# Patient Record
Sex: Female | Born: 1985 | Race: Black or African American | Hispanic: No | Marital: Single | Smoking: Never smoker
Health system: Southern US, Community
[De-identification: ages and names within clinical notes are randomized; demographics above are authoritative.]

## PROBLEM LIST (undated history)

## (undated) DIAGNOSIS — Z789 Other specified health status: Secondary | ICD-10-CM

## (undated) HISTORY — PX: WISDOM TOOTH EXTRACTION: SHX21

## (undated) HISTORY — DX: Other specified health status: Z78.9

---

## 2014-05-02 ENCOUNTER — Other Ambulatory Visit (HOSPITAL_COMMUNITY): Payer: Self-pay | Admitting: Urology

## 2014-05-02 DIAGNOSIS — Z3689 Encounter for other specified antenatal screening: Secondary | ICD-10-CM

## 2014-05-02 LAB — OB RESULTS CONSOLE ANTIBODY SCREEN: Antibody Screen: NEGATIVE

## 2014-05-02 LAB — OB RESULTS CONSOLE ABO/RH: RH TYPE: POSITIVE

## 2014-05-02 LAB — OB RESULTS CONSOLE HEPATITIS B SURFACE ANTIGEN: Hepatitis B Surface Ag: NEGATIVE

## 2014-05-02 LAB — OB RESULTS CONSOLE RPR: RPR: NONREACTIVE

## 2014-05-02 LAB — OB RESULTS CONSOLE HIV ANTIBODY (ROUTINE TESTING): HIV: NONREACTIVE

## 2014-05-02 LAB — OB RESULTS CONSOLE GC/CHLAMYDIA
Chlamydia: NEGATIVE
GC PROBE AMP, GENITAL: NEGATIVE

## 2014-05-02 LAB — OB RESULTS CONSOLE RUBELLA ANTIBODY, IGM: Rubella: IMMUNE

## 2014-05-17 NOTE — L&D Delivery Note (Cosign Needed)
Patient is 29 y.o. G2P1001 [redacted]w[redacted]d admitted for induction of labor due to post dates, hx of normal prenatal care   Delivery Note At 9:37 PM a viable female was delivered via Vaginal, Spontaneous Delivery (Presentation: Right Occiput Anterior).  APGAR: 8, 9; weight pending.   Placenta status: Intact, Spontaneous.  Cord: 3 vessels with the following complications: None.    Patient was noted to be completed dilated and +2 in station. She began pushing and delivered a female infant over intact perineum after several contractions. The anterior and posterior shoulder delivered without difficulty. The remainder of the baby was extracted from the vaginal and placed on mother's abdomen with nursing assistance. Delayed cord clamping was performed. The placenta delivered within several minutes of the infant. The uterus was massaged and noted to be firm. A 2nd degree posterior vaginal laceration was noted that required suturing. Good hemostasis was achieved. The uterus was massaged again and noted to be firm with minimal bleeding  Anesthesia: Epidural  Episiotomy: None Lacerations:  2nd degree vaginal laceration Suture Repair: 3.0 vicryl Est. Blood Loss (mL):  400  Mom to postpartum.  Baby to Couplet care / Skin to Skin.  Delivery supervised by Dr. Connye Burkitt Jama Flavors 10/28/2014, 10:00 PM  OB Fellow Attestation:  I was gloved and present for delivery in its entirety.    Complications: none  Lacerations: 2nd degree perineal laceration  EBL: 400  William Dalton, MD 10:18 PM

## 2014-05-30 ENCOUNTER — Ambulatory Visit (HOSPITAL_COMMUNITY)
Admission: RE | Admit: 2014-05-30 | Discharge: 2014-05-30 | Disposition: A | Discharge status: Home / Self Care | Payer: Medicaid Other | Source: Ambulatory Visit | Attending: Urology | Admitting: Urology

## 2014-05-30 DIAGNOSIS — Z36 Encounter for antenatal screening of mother: Secondary | ICD-10-CM | POA: Diagnosis present

## 2014-05-30 DIAGNOSIS — Z3689 Encounter for other specified antenatal screening: Secondary | ICD-10-CM

## 2014-05-30 DIAGNOSIS — Z3A19 19 weeks gestation of pregnancy: Secondary | ICD-10-CM | POA: Insufficient documentation

## 2014-06-04 ENCOUNTER — Other Ambulatory Visit (HOSPITAL_COMMUNITY): Payer: Self-pay | Admitting: Physician Assistant

## 2014-06-04 DIAGNOSIS — IMO0002 Reserved for concepts with insufficient information to code with codable children: Secondary | ICD-10-CM

## 2014-06-04 DIAGNOSIS — Z0489 Encounter for examination and observation for other specified reasons: Secondary | ICD-10-CM

## 2014-06-27 ENCOUNTER — Ambulatory Visit (HOSPITAL_COMMUNITY)
Admission: RE | Admit: 2014-06-27 | Discharge: 2014-06-27 | Disposition: A | Discharge status: Home / Self Care | Payer: Medicaid Other | Source: Ambulatory Visit | Attending: Physician Assistant | Admitting: Physician Assistant

## 2014-06-27 DIAGNOSIS — Z3A23 23 weeks gestation of pregnancy: Secondary | ICD-10-CM | POA: Insufficient documentation

## 2014-06-27 DIAGNOSIS — Z0489 Encounter for examination and observation for other specified reasons: Secondary | ICD-10-CM

## 2014-06-27 DIAGNOSIS — IMO0002 Reserved for concepts with insufficient information to code with codable children: Secondary | ICD-10-CM

## 2014-06-27 DIAGNOSIS — Z36 Encounter for antenatal screening of mother: Secondary | ICD-10-CM | POA: Insufficient documentation

## 2014-09-20 LAB — OB RESULTS CONSOLE GBS: GBS: NEGATIVE

## 2014-09-20 LAB — OB RESULTS CONSOLE GC/CHLAMYDIA
CHLAMYDIA, DNA PROBE: NEGATIVE
Gonorrhea: NEGATIVE

## 2014-10-21 ENCOUNTER — Other Ambulatory Visit (HOSPITAL_COMMUNITY): Payer: Self-pay | Admitting: Nurse Practitioner

## 2014-10-21 DIAGNOSIS — O480 Post-term pregnancy: Secondary | ICD-10-CM

## 2014-10-24 ENCOUNTER — Ambulatory Visit (HOSPITAL_COMMUNITY)
Admission: RE | Admit: 2014-10-24 | Discharge: 2014-10-24 | Disposition: A | Discharge status: Home / Self Care | Payer: Medicaid Other | Source: Ambulatory Visit | Attending: Nurse Practitioner | Admitting: Nurse Practitioner

## 2014-10-24 ENCOUNTER — Encounter (HOSPITAL_COMMUNITY): Payer: Self-pay | Admitting: *Deleted

## 2014-10-24 ENCOUNTER — Ambulatory Visit (HOSPITAL_COMMUNITY): Payer: Medicaid Other

## 2014-10-24 ENCOUNTER — Telehealth (HOSPITAL_COMMUNITY): Payer: Self-pay | Admitting: *Deleted

## 2014-10-24 DIAGNOSIS — Z3A40 40 weeks gestation of pregnancy: Secondary | ICD-10-CM | POA: Insufficient documentation

## 2014-10-24 DIAGNOSIS — O480 Post-term pregnancy: Secondary | ICD-10-CM | POA: Insufficient documentation

## 2014-10-24 NOTE — Telephone Encounter (Signed)
Preadmission screen  

## 2014-10-27 ENCOUNTER — Inpatient Hospital Stay (HOSPITAL_COMMUNITY)
Admission: AD | Admit: 2014-10-27 | Discharge: 2014-10-27 | Disposition: A | Discharge status: Home / Self Care | Payer: Medicaid Other | Source: Ambulatory Visit | Attending: Family Medicine | Admitting: Family Medicine

## 2014-10-27 ENCOUNTER — Encounter (HOSPITAL_COMMUNITY): Payer: Self-pay | Admitting: *Deleted

## 2014-10-27 DIAGNOSIS — Z3A41 41 weeks gestation of pregnancy: Secondary | ICD-10-CM | POA: Diagnosis present

## 2014-10-27 DIAGNOSIS — O480 Post-term pregnancy: Principal | ICD-10-CM | POA: Diagnosis present

## 2014-10-27 NOTE — MAU Note (Signed)
Pt. States she has been having back pain since 2pm today. Denies bleeding. States she is having a starchy discharge. Denies LOF. Pt. States baby is moving well.

## 2014-10-27 NOTE — MAU Note (Signed)
Dr. Doroteo Glassman and Marlynn Perking, CNM reviewed fetal monitoring strip. Ok for discharge due to no change in cervical exam. Pt. To be discharged with labor precautions and fetal kick counts. Pt. Agreeable to POC.

## 2014-10-27 NOTE — Discharge Instructions (Signed)
Keep Follow up appointment for Induction of labor for October 28, 2014  Braxton Hicks Contractions Contractions of the uterus can occur throughout pregnancy. Contractions are not always a sign that you are in labor.  WHAT ARE BRAXTON HICKS CONTRACTIONS?  Contractions that occur before labor are called Braxton Hicks contractions, or false labor. Toward the end of pregnancy (32-34 weeks), these contractions can develop more often and may become more forceful. This is not true labor because these contractions do not result in opening (dilatation) and thinning of the cervix. They are sometimes difficult to tell apart from true labor because these contractions can be forceful and people have different pain tolerances. You should not feel embarrassed if you go to the hospital with false labor. Sometimes, the only way to tell if you are in true labor is for your health care provider to look for changes in the cervix. If there are no prenatal problems or other health problems associated with the pregnancy, it is completely safe to be sent home with false labor and await the onset of true labor. HOW CAN YOU TELL THE DIFFERENCE BETWEEN TRUE AND FALSE LABOR? False Labor  The contractions of false labor are usually shorter and not as hard as those of true labor.   The contractions are usually irregular.   The contractions are often felt in the front of the lower abdomen and in the groin.   The contractions may go away when you walk around or change positions while lying down.   The contractions get weaker and are shorter lasting as time goes on.   The contractions do not usually become progressively stronger, regular, and closer together as with true labor.  True Labor  Contractions in true labor last 30-70 seconds, become very regular, usually become more intense, and increase in frequency.   The contractions do not go away with walking.   The discomfort is usually felt in the top of the uterus  and spreads to the lower abdomen and low back.   True labor can be determined by your health care provider with an exam. This will show that the cervix is dilating and getting thinner.  WHAT TO REMEMBER  Keep up with your usual exercises and follow other instructions given by your health care provider.   Take medicines as directed by your health care provider.   Keep your regular prenatal appointments.   Eat and drink lightly if you think you are going into labor.   If Braxton Hicks contractions are making you uncomfortable:   Change your position from lying down or resting to walking, or from walking to resting.   Sit and rest in a tub of warm water.   Drink 2-3 glasses of water. Dehydration may cause these contractions.   Do slow and deep breathing several times an hour.  WHEN SHOULD I SEEK IMMEDIATE MEDICAL CARE? Seek immediate medical care if:  Your contractions become stronger, more regular, and closer together.   You have fluid leaking or gushing from your vagina.   You have a fever.   You pass blood-tinged mucus.   You have vaginal bleeding.   You have continuous abdominal pain.   You have low back pain that you never had before.   You feel your baby's head pushing down and causing pelvic pressure.   Your baby is not moving as much as it used to.  Document Released: 05/03/2005 Document Revised: 05/08/2013 Document Reviewed: 02/12/2013 [facility] Patient Information 2015 [place], [place]. This information  is not intended to replace advice given to you by your health care provider. Make sure you discuss any questions you have with your health care provider. Fetal Movement Counts Patient Name: __________________________________________________ Patient Due Date: ____________________ Performing a fetal movement count is highly recommended in high-risk pregnancies, but it is good for every pregnant woman to do. Your health care provider may ask you to  start counting fetal movements at 28 weeks of the pregnancy. Fetal movements often increase:  After eating a full meal.  After physical activity.  After eating or drinking something sweet or cold.  At rest. Pay attention to when you feel the baby is most active. This will help you notice a pattern of your baby's sleep and wake cycles and what factors contribute to an increase in fetal movement. It is important to perform a fetal movement count at the same time each day when your baby is normally most active.  HOW TO COUNT FETAL MOVEMENTS  Find a quiet and comfortable area to sit or lie down on your left side. Lying on your left side provides the best blood and oxygen circulation to your baby.  Write down the day and time on a sheet of paper or in a journal.  Start counting kicks, flutters, swishes, rolls, or jabs in a 2-hour period. You should feel at least 10 movements within 2 hours.  If you do not feel 10 movements in 2 hours, wait 2-3 hours and count again. Look for a change in the pattern or not enough counts in 2 hours. SEEK MEDICAL CARE IF:  You feel less than 10 counts in 2 hours, tried twice.  There is no movement in over an hour.  The pattern is changing or taking longer each day to reach 10 counts in 2 hours.  You feel the baby is not moving as he or she usually does. Date: ____________ Movements: ____________ Start time: ____________ Doreatha Martin time: ____________  Date: ____________ Movements: ____________ Start time: ____________ Doreatha Martin time: ____________ Date: ____________ Movements: ____________ Start time: ____________ Doreatha Martin time: ____________ Date: ____________ Movements: ____________ Start time: ____________ Doreatha Martin time: ____________ Date: ____________ Movements: ____________ Start time: ____________ Doreatha Martin time: ____________ Date: ____________ Movements: ____________ Start time: ____________ Doreatha Martin time: ____________ Date: ____________ Movements: ____________ Start  time: ____________ Doreatha Martin time: ____________ Date: ____________ Movements: ____________ Start time: ____________ Doreatha Martin time: ____________  Date: ____________ Movements: ____________ Start time: ____________ Doreatha Martin time: ____________ Date: ____________ Movements: ____________ Start time: ____________ Doreatha Martin time: ____________ Date: ____________ Movements: ____________ Start time: ____________ Doreatha Martin time: ____________ Date: ____________ Movements: ____________ Start time: ____________ Doreatha Martin time: ____________ Date: ____________ Movements: ____________ Start time: ____________ Doreatha Martin time: ____________ Date: ____________ Movements: ____________ Start time: ____________ Doreatha Martin time: ____________ Date: ____________ Movements: ____________ Start time: ____________ Doreatha Martin time: ____________  Date: ____________ Movements: ____________ Start time: ____________ Doreatha Martin time: ____________ Date: ____________ Movements: ____________ Start time: ____________ Doreatha Martin time: ____________ Date: ____________ Movements: ____________ Start time: ____________ Doreatha Martin time: ____________ Date: ____________ Movements: ____________ Start time: ____________ Doreatha Martin time: ____________ Date: ____________ Movements: ____________ Start time: ____________ Doreatha Martin time: ____________ Date: ____________ Movements: ____________ Start time: ____________ Doreatha Martin time: ____________ Date: ____________ Movements: ____________ Start time: ____________ Doreatha Martin time: ____________  Date: ____________ Movements: ____________ Start time: ____________ Doreatha Martin time: ____________ Date: ____________ Movements: ____________ Start time: ____________ Doreatha Martin time: ____________ Date: ____________ Movements: ____________ Start time: ____________ Doreatha Martin time: ____________ Date: ____________ Movements: ____________ Start time: ____________ Doreatha Martin time: ____________ Date: ____________ Movements: ____________ Start time: ____________ Doreatha Martin time:  ____________ Date:  ____________ Movements: ____________ Start time: ____________ Doreatha Martin time: ____________ Date: ____________ Movements: ____________ Start time: ____________ Doreatha Martin time: ____________  Date: ____________ Movements: ____________ Start time: ____________ Doreatha Martin time: ____________ Date: ____________ Movements: ____________ Start time: ____________ Doreatha Martin time: ____________ Date: ____________ Movements: ____________ Start time: ____________ Doreatha Martin time: ____________ Date: ____________ Movements: ____________ Start time: ____________ Doreatha Martin time: ____________ Date: ____________ Movements: ____________ Start time: ____________ Doreatha Martin time: ____________ Date: ____________ Movements: ____________ Start time: ____________ Doreatha Martin time: ____________ Date: ____________ Movements: ____________ Start time: ____________ Doreatha Martin time: ____________  Date: ____________ Movements: ____________ Start time: ____________ Doreatha Martin time: ____________ Date: ____________ Movements: ____________ Start time: ____________ Doreatha Martin time: ____________ Date: ____________ Movements: ____________ Start time: ____________ Doreatha Martin time: ____________ Date: ____________ Movements: ____________ Start time: ____________ Doreatha Martin time: ____________ Date: ____________ Movements: ____________ Start time: ____________ Doreatha Martin time: ____________ Date: ____________ Movements: ____________ Start time: ____________ Doreatha Martin time: ____________ Date: ____________ Movements: ____________ Start time: ____________ Doreatha Martin time: ____________  Date: ____________ Movements: ____________ Start time: ____________ Doreatha Martin time: ____________ Date: ____________ Movements: ____________ Start time: ____________ Doreatha Martin time: ____________ Date: ____________ Movements: ____________ Start time: ____________ Doreatha Martin time: ____________ Date: ____________ Movements: ____________ Start time: ____________ Doreatha Martin time: ____________ Date: ____________ Movements:  ____________ Start time: ____________ Doreatha Martin time: ____________ Date: ____________ Movements: ____________ Start time: ____________ Doreatha Martin time: ____________ Date: ____________ Movements: ____________ Start time: ____________ Doreatha Martin time: ____________  Date: ____________ Movements: ____________ Start time: ____________ Doreatha Martin time: ____________ Date: ____________ Movements: ____________ Start time: ____________ Doreatha Martin time: ____________ Date: ____________ Movements: ____________ Start time: ____________ Doreatha Martin time: ____________ Date: ____________ Movements: ____________ Start time: ____________ Doreatha Martin time: ____________ Date: ____________ Movements: ____________ Start time: ____________ Doreatha Martin time: ____________ Date: ____________ Movements: ____________ Start time: ____________ Doreatha Martin time: ____________ Document Released: 06/02/2006 Document Revised: 09/17/2013 Document Reviewed: 02/28/2012 [employer] Patient Information 2015 [place] [employer]. This information is not intended to replace advice given to you by your health care provider. Make sure you discuss any questions you have with your health care provider.

## 2014-10-28 ENCOUNTER — Inpatient Hospital Stay (HOSPITAL_COMMUNITY): Payer: Medicaid Other | Admitting: Anesthesiology

## 2014-10-28 ENCOUNTER — Encounter (HOSPITAL_COMMUNITY): Payer: Self-pay

## 2014-10-28 ENCOUNTER — Inpatient Hospital Stay (HOSPITAL_COMMUNITY)
Admission: RE | Admit: 2014-10-28 | Discharge: 2014-10-30 | DRG: 775 | Disposition: A | Discharge status: Home / Self Care | Payer: Medicaid Other | Source: Ambulatory Visit | Attending: Obstetrics and Gynecology | Admitting: Obstetrics and Gynecology

## 2014-10-28 DIAGNOSIS — Z3A41 41 weeks gestation of pregnancy: Secondary | ICD-10-CM | POA: Diagnosis present

## 2014-10-28 DIAGNOSIS — O480 Post-term pregnancy: Secondary | ICD-10-CM | POA: Diagnosis present

## 2014-10-28 LAB — CBC
HCT: 34 % — ABNORMAL LOW (ref 36.0–46.0)
HEMATOCRIT: 37.1 % (ref 36.0–46.0)
HEMOGLOBIN: 13.3 g/dL (ref 12.0–15.0)
Hemoglobin: 12.1 g/dL (ref 12.0–15.0)
MCH: 30 pg (ref 26.0–34.0)
MCH: 29.4 pg (ref 26.0–34.0)
MCHC: 35.8 g/dL (ref 30.0–36.0)
MCHC: 35.6 g/dL (ref 30.0–36.0)
MCV: 83.6 fL (ref 78.0–100.0)
MCV: 82.7 fL (ref 78.0–100.0)
PLATELETS: 91 10*3/uL — AB (ref 150–400)
PLATELETS: 141 10*3/uL — AB (ref 150–400)
RBC: 4.44 MIL/uL (ref 3.87–5.11)
RBC: 4.11 MIL/uL (ref 3.87–5.11)
RDW: 14.4 % (ref 11.5–15.5)
RDW: 14.3 % (ref 11.5–15.5)
WBC: 7.7 10*3/uL (ref 4.0–10.5)
WBC: 11.2 10*3/uL — ABNORMAL HIGH (ref 4.0–10.5)

## 2014-10-28 LAB — ABO/RH: ABO/RH(D): O POS

## 2014-10-28 LAB — PLATELET COUNT: PLATELETS: 155 10*3/uL (ref 150–400)

## 2014-10-28 LAB — RPR: RPR Ser Ql: NONREACTIVE

## 2014-10-28 LAB — TYPE AND SCREEN
ABO/RH(D): O POS
ANTIBODY SCREEN: NEGATIVE

## 2014-10-28 MED ORDER — BENZOCAINE-MENTHOL 20-0.5 % EX AERO
1.0000 "application " | INHALATION_SPRAY | CUTANEOUS | Status: DC | PRN
  Administered 2014-10-29: 1 via TOPICAL
  Filled 2014-10-28: qty 56

## 2014-10-28 MED ORDER — MISOPROSTOL 50MCG HALF TABLET
50.0000 ug | ORAL_TABLET | Freq: Once | ORAL | Status: AC
  Administered 2014-10-28: 50 ug via ORAL
  Filled 2014-10-28: qty 0.5

## 2014-10-28 MED ORDER — ONDANSETRON HCL 4 MG/2ML IJ SOLN: 4.0000 mg | Freq: Four times a day (QID) | INTRAMUSCULAR | Status: DC | PRN

## 2014-10-28 MED ORDER — DIPHENHYDRAMINE HCL 25 MG PO CAPS: 25.0000 mg | ORAL_CAPSULE | Freq: Four times a day (QID) | ORAL | Status: DC | PRN

## 2014-10-28 MED ORDER — ZOLPIDEM TARTRATE 5 MG PO TABS: 5.0000 mg | ORAL_TABLET | Freq: Every evening | ORAL | Status: DC | PRN

## 2014-10-28 MED ORDER — EPHEDRINE 5 MG/ML INJ
10.0000 mg | INTRAVENOUS | Status: DC | PRN
  Filled 2014-10-28: qty 2

## 2014-10-28 MED ORDER — WITCH HAZEL-GLYCERIN EX PADS
1.0000 "application " | MEDICATED_PAD | CUTANEOUS | Status: DC | PRN
  Administered 2014-10-29: 1 via TOPICAL

## 2014-10-28 MED ORDER — ACETAMINOPHEN 325 MG PO TABS: 650.0000 mg | ORAL_TABLET | ORAL | Status: DC | PRN

## 2014-10-28 MED ORDER — OXYTOCIN 40 UNITS IN LACTATED RINGERS INFUSION - SIMPLE MED
1.0000 m[IU]/min | INTRAVENOUS | Status: DC
  Administered 2014-10-28: 2 m[IU]/min via INTRAVENOUS
  Filled 2014-10-28: qty 1000

## 2014-10-28 MED ORDER — OXYCODONE-ACETAMINOPHEN 5-325 MG PO TABS: 1.0000 | ORAL_TABLET | ORAL | Status: DC | PRN

## 2014-10-28 MED ORDER — TERBUTALINE SULFATE 1 MG/ML IJ SOLN
0.2500 mg | Freq: Once | INTRAMUSCULAR | Status: DC | PRN
  Filled 2014-10-28: qty 1

## 2014-10-28 MED ORDER — ONDANSETRON HCL 4 MG/2ML IJ SOLN: 4.0000 mg | INTRAMUSCULAR | Status: DC | PRN

## 2014-10-28 MED ORDER — OXYTOCIN BOLUS FROM INFUSION
500.0000 mL | INTRAVENOUS | Status: DC
  Administered 2014-10-28: 500 mL via INTRAVENOUS

## 2014-10-28 MED ORDER — FENTANYL CITRATE (PF) 100 MCG/2ML IJ SOLN: 100.0000 ug | INTRAMUSCULAR | Status: DC | PRN

## 2014-10-28 MED ORDER — FENTANYL 2.5 MCG/ML BUPIVACAINE 1/10 % EPIDURAL INFUSION (WH - ANES)
14.0000 mL/h | INTRAMUSCULAR | Status: DC | PRN
  Administered 2014-10-28 (×2): 14 mL/h via EPIDURAL
  Filled 2014-10-28: qty 125

## 2014-10-28 MED ORDER — OXYTOCIN 40 UNITS IN LACTATED RINGERS INFUSION - SIMPLE MED: 62.5000 mL/h | INTRAVENOUS | Status: DC

## 2014-10-28 MED ORDER — ONDANSETRON HCL 4 MG PO TABS
4.0000 mg | ORAL_TABLET | ORAL | Status: DC | PRN
Start: 2014-10-28 — End: 2014-10-30

## 2014-10-28 MED ORDER — ACETAMINOPHEN 325 MG PO TABS
650.0000 mg | ORAL_TABLET | ORAL | Status: DC | PRN
Start: 2014-10-28 — End: 2014-10-30

## 2014-10-28 MED ORDER — PHENYLEPHRINE 40 MCG/ML (10ML) SYRINGE FOR IV PUSH (FOR BLOOD PRESSURE SUPPORT)
80.0000 ug | PREFILLED_SYRINGE | INTRAVENOUS | Status: DC | PRN
  Filled 2014-10-28: qty 20
  Filled 2014-10-28: qty 2

## 2014-10-28 MED ORDER — TETANUS-DIPHTH-ACELL PERTUSSIS 5-2.5-18.5 LF-MCG/0.5 IM SUSP: 0.5000 mL | Freq: Once | INTRAMUSCULAR | Status: DC

## 2014-10-28 MED ORDER — PRENATAL MULTIVITAMIN CH
1.0000 | ORAL_TABLET | Freq: Every day | ORAL | Status: DC
  Administered 2014-10-29 – 2014-10-30 (×2): 1 via ORAL
  Filled 2014-10-28 (×2): qty 1

## 2014-10-28 MED ORDER — LANOLIN HYDROUS EX OINT
TOPICAL_OINTMENT | CUTANEOUS | Status: DC | PRN
Start: 2014-10-28 — End: 2014-10-30

## 2014-10-28 MED ORDER — DIBUCAINE 1 % RE OINT: 1.0000 "application " | TOPICAL_OINTMENT | RECTAL | Status: DC | PRN

## 2014-10-28 MED ORDER — OXYCODONE-ACETAMINOPHEN 5-325 MG PO TABS
2.0000 | ORAL_TABLET | ORAL | Status: DC | PRN
Start: 2014-10-28 — End: 2014-10-28

## 2014-10-28 MED ORDER — DIPHENHYDRAMINE HCL 50 MG/ML IJ SOLN: 12.5000 mg | INTRAMUSCULAR | Status: DC | PRN

## 2014-10-28 MED ORDER — IBUPROFEN 600 MG PO TABS
600.0000 mg | ORAL_TABLET | Freq: Four times a day (QID) | ORAL | Status: DC
  Administered 2014-10-29 – 2014-10-30 (×7): 600 mg via ORAL
  Filled 2014-10-28 (×7): qty 1

## 2014-10-28 MED ORDER — LACTATED RINGERS IV SOLN
INTRAVENOUS | Status: DC
  Administered 2014-10-28: 09:00:00 via INTRAVENOUS

## 2014-10-28 MED ORDER — SENNOSIDES-DOCUSATE SODIUM 8.6-50 MG PO TABS
2.0000 | ORAL_TABLET | ORAL | Status: DC
  Administered 2014-10-29 (×2): 2 via ORAL
  Filled 2014-10-28 (×2): qty 2

## 2014-10-28 MED ORDER — LIDOCAINE HCL (PF) 1 % IJ SOLN
INTRAMUSCULAR | Status: DC | PRN
  Administered 2014-10-28 (×2): 9 mL

## 2014-10-28 MED ORDER — LIDOCAINE HCL (PF) 1 % IJ SOLN
30.0000 mL | INTRAMUSCULAR | Status: DC | PRN
  Filled 2014-10-28: qty 30

## 2014-10-28 MED ORDER — SIMETHICONE 80 MG PO CHEW: 80.0000 mg | CHEWABLE_TABLET | ORAL | Status: DC | PRN

## 2014-10-28 MED ORDER — LACTATED RINGERS IV SOLN
500.0000 mL | INTRAVENOUS | Status: DC | PRN
  Administered 2014-10-28: 500 mL via INTRAVENOUS

## 2014-10-28 MED ORDER — CITRIC ACID-SODIUM CITRATE 334-500 MG/5ML PO SOLN: 30.0000 mL | ORAL | Status: DC | PRN

## 2014-10-28 NOTE — Progress Notes (Signed)
Labor Progress Note  S: Pt appears comfortable, not requiring pain meds.   O:  BP 130/87 mmHg  Pulse 82  Temp(Src) 98 F (36.7 C) (Oral)  Resp 20  Ht 5\' 6"  (1.676 m)  Wt 78.472 kg (173 lb)  BMI 27.94 kg/m2  LMP 01/14/2014 Cat II SVE 1410 Dilation 4.5cm, 60% effacement, Station -3; -2, Vertex. Exam by Job Founds, RNC  A&P: 29 y.o. G2P1001 [redacted]w[redacted]d presenting for IOL 2/2 post-dates. Doing well.  Cat II FHR based on last 10 min tracing with minimal variability, no accels, decels (variable and early).  Labor progressing well on pitocin(currently at 4 mu/min 1504).  Continue to monitor. Pain management per pt request.   Jingpeng He, Med Student 4:08 PM   RESIDENT ADDENDUM I have separately seen and examined the patient. I have discussed the findings and exam with the medical student and agree with the above note.  Caryl Ada, DO 10/28/2014, 4:28 PM PGY-1, [facility]

## 2014-10-28 NOTE — Progress Notes (Signed)
Patient ID: Tammie Harris, female   DOB: February 06, 1986, 29 y.o.   MRN: [id number] Labor Progress Note  S:  Resting comfortably with epidural. Hungry. Starting to feel a little pressure.   O:  BP 138/80 mmHg  Pulse 84  Temp(Src) 98.3 F (36.8 C) (Oral)  Resp 20  Ht 5\' 6"  (1.676 m)  Wt 78.472 kg (173 lb)  BMI 27.94 kg/m2  SpO2 99%  LMP 01/14/2014 Cat I - baseline 130, mod variability, occasional early deceleration Toco - ctx q1-3 minutes CVE:  Dilation: 7.5 Effacement (%): 90 Cervical Position: Middle Station: -1 Presentation: Vertex Exam by:: Lauraine Rinne, RN    A&P: 29 y.o. G2P1001 [redacted]w[redacted]d admitted for IOL due to postdates # Labor. Progressing well on pitocin # FWB. Cat I # Analgesia. Epidural in place Anticipate vaginal delivery  Fabio Asa, MD 8:37 PM

## 2014-10-28 NOTE — Progress Notes (Signed)
Labor Progress Note  Tammie Harris is a 29 y.o. G2P1001 at [redacted]w[redacted]d  admitted for induction of labor due to Post dates.  S: Pt comfortable now with epidural. Doing well with no complaints  O:  BP 130/80 mmHg  Pulse 84  Temp(Src) 97.8 F (36.6 C) (Oral)  Resp 18  Ht 5\' 6"  (1.676 m)  Wt 173 lb (78.472 kg)  BMI 27.94 kg/m2  SpO2 99%  LMP 01/14/2014 FHT:  FHR: 135 bpm, variability: moderate,  accelerations:  Present,  decelerations:  Present early, variable UC:   regular, every 2-3 minutes SVE:   Dilation: 6.5 Effacement (%): 70 Station: -2 Exam by:: H. Stone, RNC SROM @1713 :clear  Pitocin @ 19mu/min  Labs: Lab Results  Component Value Date   WBC 7.7 10/28/2014   HGB 13.3 10/28/2014   HCT 37.1 10/28/2014   MCV 83.6 10/28/2014   PLT 155 10/28/2014    Assessment / Plan: 29 y.o. G2P1001 [redacted]w[redacted]d in active labor Induction of labor due to postterm,  progressing well on pitocin  Labor: Progressing on Pitocin, now s/p SROM.  Fetal Wellbeing:  Category II Pain Control:  Epidural Anticipated MOD:  NSVD  Expectant management   Caryl Ada, DO 10/28/2014, 6:36 PM PGY-1, [facility]

## 2014-10-28 NOTE — Anesthesia Preprocedure Evaluation (Signed)
Anesthesia Evaluation  Patient identified by MRN, date of birth, ID band Patient awake    Reviewed: Allergy & Precautions, H&P , NPO status , Patient's Chart, lab work & pertinent test results  Airway Mallampati: I  TM Distance: >3 FB Neck ROM: full    Dental no notable dental hx.    Pulmonary neg pulmonary ROS,    Pulmonary exam normal       Cardiovascular negative cardio ROS Normal cardiovascular exam    Neuro/Psych negative neurological ROS  negative psych ROS   GI/Hepatic negative GI ROS, Neg liver ROS,   Endo/Other  negative endocrine ROS  Renal/GU negative Renal ROS     Musculoskeletal negative musculoskeletal ROS (+)   Abdominal Normal abdominal exam  (+)   Peds negative pediatric ROS (+)  Hematology negative hematology ROS (+)   Anesthesia Other Findings   Reproductive/Obstetrics (+) Pregnancy                             Anesthesia Physical Anesthesia Plan  ASA: II  Anesthesia Plan: Epidural   Post-op Pain Management:    Induction:   Airway Management Planned:   Additional Equipment:   Intra-op Plan:   Post-operative Plan:   Informed Consent: I have reviewed the patients History and Physical, chart, labs and discussed the procedure including the risks, benefits and alternatives for the proposed anesthesia with the patient or authorized representative who has indicated his/her understanding and acceptance.     Plan Discussed with:   Anesthesia Plan Comments:         Anesthesia Quick Evaluation

## 2014-10-28 NOTE — Progress Notes (Signed)
LABOR PROGRESS NOTE  Tammie Harris is a 29 y.o. G2P1001 at [redacted]w[redacted]d  admitted for induction of labor due to Post dates. Due date 10/21/14.  Subjective: Comfortable. Feeling mild contractions.   Objective: BP 105/67 mmHg  Pulse 88  Temp(Src) 98 F (36.7 C) (Oral)  Resp 20  Ht 5\' 6"  (1.676 m)  Wt 173 lb (78.472 kg)  BMI 27.94 kg/m2  LMP 01/14/2014 or  Filed Vitals:   10/28/14 0757 10/28/14 0810 10/28/14 0942 10/28/14 1145  BP: 117/76  115/68 105/67  Pulse: 96  89 88  Temp: 98.2 F (36.8 C)   98 F (36.7 C)  TempSrc: Oral   Oral  Resp: 20   20  Height:  5\' 6"  (1.676 m)    Weight:  173 lb (78.472 kg)         FHT:  FHR: 135 bpm, variability: moderate,  accelerations:  Present,  decelerations:  Absent UC:   irregular, every 3-5 minutes SVE:   Dilation: 4 Effacement (%): 60 Station: -3, -2 Exam by:: H. Stone, RNC  Dilation: 4 Effacement (%): 60 Cervical Position: Middle Station: -3, -2 Presentation: Vertex Exam by:: Tammie Harris, RNC  Pitocin not yet started  Labs: Lab Results  Component Value Date   WBC 7.7 10/28/2014   HGB 13.3 10/28/2014   HCT 37.1 10/28/2014   MCV 83.6 10/28/2014   PLT 155 10/28/2014    Assessment / Plan: Induction of labor due to post dates. Early in induction.  Labor: Early in induction. Status post foley bulb and cytotec x 1. Contracting irregularly. Plan to check cervix and start pitocin at 1345. Consider AROM for further augmentation when able.  Fetal Wellbeing:  Category I Pain Control:  Epidural Anticipated MOD:  NSVD  William Dalton, MD 10/28/2014, 1:14 PM

## 2014-10-28 NOTE — Anesthesia Procedure Notes (Signed)
Epidural Patient location during procedure: OB Start time: 10/28/2014 5:40 PM End time: 10/28/2014 5:44 PM  Staffing Anesthesiologist: Leilani Able Performed by: anesthesiologist   Preanesthetic Checklist Completed: patient identified, surgical consent, pre-op evaluation, timeout performed, IV checked, risks and benefits discussed and monitors and equipment checked  Epidural Patient position: sitting Prep: site prepped and draped and DuraPrep Patient monitoring: continuous pulse ox and blood pressure Approach: midline Location: L3-L4 Injection technique: LOR air  Needle:  Needle type: Tuohy  Needle gauge: 17 G Needle length: 9 cm and 9 Needle insertion depth: 5 cm cm Catheter type: closed end flexible Catheter size: 19 Gauge Catheter at skin depth: 10 cm Test dose: negative and Other  Assessment Sensory level: T9 Events: blood not aspirated, injection not painful, no injection resistance, negative IV test and no paresthesia  Additional Notes Reason for block:procedure for pain

## 2014-10-28 NOTE — H&P (Signed)
LABOR ADMISSION HISTORY AND PHYSICAL  Tammie Harris is a 29 y.o. female G2P1001 with IUP at [redacted]w[redacted]d by [redacted]w[redacted]d [place] presenting for IOL 2/2 post-dates pregnancy. She reports +FMs, No LOF, no VB, no blurry vision, headaches or peripheral edema, and RUQ pain.  She plans on breast feeding. She is undecided on birth control.  Dating: By LMP c/w [redacted]w[redacted]d sono --->  Estimated Date of Delivery: 10/21/14   Prenatal History/Complications: -Received care at [facility]  Past Medical History: Past Medical History  Diagnosis Date  . Medical history non-contributory     Past Surgical History: Past Surgical History  Procedure Laterality Date  . No past surgeries      Obstetrical History: OB History    Gravida Para Term Preterm AB TAB SAB Ectopic Multiple Living   Social History: History   Social History  . Marital Status: Single    Spouse Name: N/A  . Number of Children: N/A  . Years of Education: N/A   Social History Main Topics  . Smoking status: Never Smoker   . Smokeless tobacco: Never Used  . Alcohol Use: No  . Drug Use: No  . Sexual Activity: Yes   Other Topics Concern  . Not on file   Social History Narrative    Family History: Family History  Problem Relation Age of Onset  . Alcohol abuse Neg Hx   . Arthritis Neg Hx   . Asthma Neg Hx   . Cancer Neg Hx   . Birth defects Neg Hx   . COPD Neg Hx   . Depression Neg Hx   . Diabetes Neg Hx   . Drug abuse Neg Hx   . Early death Neg Hx   . Hearing loss Neg Hx   . Heart disease Neg Hx   . Hyperlipidemia Neg Hx   . Hypertension Neg Hx   . Kidney disease Neg Hx   . Learning disabilities Neg Hx   . Mental illness Neg Hx   . Mental retardation Neg Hx   . Miscarriages / Stillbirths Neg Hx   . Stroke Neg Hx   . Vision loss Neg Hx   . Varicose Veins Neg Hx     Allergies: Allergies  Allergen Reactions  . Quinine Derivatives Itching    itching    Prescriptions prior to admission  Medication Sig Dispense  Refill Last Dose  . Prenatal Vit-Fe Fumarate-FA (MULTIVITAMIN-PRENATAL) 27-0.8 MG TABS tablet Take 1 tablet by mouth daily at 12 noon.   10/26/2014 at Unknown time     Review of Systems   All systems reviewed and negative except as stated in HPI  Blood pressure 117/76, pulse 96, temperature 98.2 F (36.8 C), temperature source Oral, resp. rate 20, last menstrual period 01/14/2014. General appearance: alert and cooperative Lungs: clear to auscultation bilaterally Heart: regular rate and rhythm Abdomen: soft, non-tender; bowel sounds normal Pelvic: adequate Extremities: Homans sign is negative, no sign of DVT DTR's 2+ Presentation: cephalic    Prenatal labs: ABO, Rh: O/Positive/-- (12/17 0000) Antibody: Negative (12/17 0000) Rubella:  Immune RPR: Nonreactive (12/17 0000)  HBsAg: Negative (12/17 0000)  HIV: Non-reactive (12/17 0000)  GBS: Negative (05/06 0000)  1 hr Glucola 144, 3h 90, 146, 117, 97 Genetic screening  neg Anatomy US normal  Prenatal Transfer Tool  Maternal Diabetes: No Genetic Screening: Normal Maternal Ultrasounds/Referrals: Normal Fetal Ultrasounds or other Referrals:  None Maternal Substance Abuse:  No Significant Maternal Medications:  None Significant Maternal Lab Results: Lab values include: Group B Strep negative  No results found for this or any previous visit (from the past 24 hour(s)).  Patient Active Problem List   Diagnosis Date Noted  . Post term pregnancy, antepartum condition or complication   . [redacted] weeks gestation of pregnancy   . Evaluate anatomy not seen on prior sonogram   . [redacted] weeks gestation of pregnancy   . Encounter for fetal anatomic survey   . [redacted] weeks gestation of pregnancy     Assessment: Tammie Harris is a 29 y.o. G2P1001 at [redacted]w[redacted]d here for IOL 2/2 post-dates pregnancy.   #Labor: IOL with FB and cytotec PO. Bishop score 4. Beside [place] with vertex fetus.   Expectant delivery via SVB #Pain: Epidural upon request #FWB: Cat  I #ID: GBS neg #MOF: Breast #MOC: Alla German, DO 10/28/2014, 8:39 AM PGY-1, [facility]    OB fellow attestation:  I have seen and examined this patient; I agree with above documentation in the resident's note.   Tammie Harris is a 29 y.o. G2P1001 here for IOL 2/2 postdates  PE: BP 105/67 mmHg  Pulse 88  Temp(Src) 98 F (36.7 C) (Oral)  Resp 20  Ht 5\' 6"  (1.676 m)  Wt 173 lb (78.472 kg)  BMI 27.94 kg/m2  LMP 01/14/2014 Gen: calm comfortable, NAD Resp: normal effort, no distress Abd: gravid  ROS, labs, PMH reviewed  Plan: MOF: breast MOC: undecided ID: GBS neg FWB: cat I Labor: FB and cytotec Pain: epidural once FB out  Quintin Hjort ROCIO 10/28/2014, 11:55 AM

## 2014-10-29 NOTE — Progress Notes (Signed)
Patient ID: Tammie Harris, female   DOB: 1986/01/07, 29 y.o.   MRN: [id number] Post Partum Day 1 Subjective:  Tammie Harris is a 29 y.o. [id number] [redacted]w[redacted]d s/p NSVD at 2137 PM.  No acute events overnight.  Pt denies problems with ambulating, voiding or po intake.  She denies nausea or vomiting.  Pain is well controlled.  She has had flatus. She has not had bowel movement.  Lochia Moderate.  Plan for birth control is oral contraceptives (estrogen/progesterone).  Method of Feeding: breast, still not much milk supply  Objective: Blood pressure 107/66, pulse 84, temperature 97.9 F (36.6 C), temperature source Oral, resp. rate 18, height 5\' 6"  (1.676 m), weight 78.472 kg (173 lb), last menstrual period 01/14/2014, SpO2 99 %, unknown if currently breastfeeding.  Physical Exam:  General: alert, cooperative and no distress Lochia:normal flow Chest: CTAB Heart: RRR no m/r/g Abdomen: +BS, soft, nontender,  Uterine Fundus: firm, well below umbilicus DVT Evaluation: No evidence of DVT seen on physical exam. Extremities: no edema   Recent Labs  10/28/14 0841 10/28/14 2228  HGB 13.3 12.1  HCT 37.1 34.0*    Assessment/Plan:  ASSESSMENT: Tammie Harris is a 29 y.o. [id number] s/p NSVD at [redacted]w[redacted]d. Doing well on postpartum day #1. Plan for OCP for birth control.    Plan for discharge tomorrow, Breastfeeding and Lactation consult   LOS: 1 day   Fabio Asa 10/29/2014, 7:37 AM    OB fellow attestation Post Partum Day 1 I have seen and examined this patient and agree with above documentation in the resident's note.   Tammie Harris is a 29 y.o. G2P2002 s/p NSVD.  Pt denies problems with ambulating, voiding or po intake. Pain is well controlled.  Plan for birth control is oral progesterone-only contraceptive.  Method of Feeding: breast  PE:  BP 107/66 mmHg  Pulse 84  Temp(Src) 97.9 F (36.6 C) (Oral)  Resp 18  Ht 5\' 6"  (1.676 m)  Wt 173 lb (78.472 kg)  BMI 27.94 kg/m2  SpO2 99%  LMP  01/14/2014  Breastfeeding? Unknown Fundus firm  Plan for discharge: PPD#2  William Dalton, MD 7:41 AM

## 2014-10-29 NOTE — Lactation Note (Signed)
This note was copied from the chart of Tammie Sakari Hoke. Lactation Consultation Note Experienced BF mom Bf her 8 yr. Old daughter for 9 months breast and bottle/formula. Mom sitting in chair BF baby in cross-cradle position. Mom was really squeezing her breast in a pinching hold. Asked mom to lighten up on the hold on the breast. Explained clogged ducts. Noted swollen supramamillary glands under both axillary areas. Mom stated she got that with her daughter and it gets uncomfortable. Encouraged to put ICE to that area and raise her arm and massage downwards. Mom has good everted nipples, states they are sore, RN gave comfort gels. Mom encouraged to feed baby 8-12 times/24 hours and with feeding cues. Mom encouraged to waken baby for feeds. Mom encouraged to do skin-to-skin to assist w/increase in milk production. Educated about newborn behavior, supply and demand, I&O.Referred to Baby and Me Book in Breastfeeding section Pg. 22-23 for position options and Proper latch demonstration.[facility] brochure given w/resources, support groups and LC services. Patient Name: Tammie HarrisM Date: 10/29/2014 Reason for consult: Initial assessment   Maternal Data Has patient been taught Hand Expression?: Yes Does the patient have breastfeeding experience prior to this delivery?: Yes  Feeding Feeding Type: Breast Fed Length of feed: 20 min (still BF)  LATCH Score/Interventions Latch: Grasps breast easily, tongue down, lips flanged, rhythmical sucking. Intervention(s): Adjust position;Breast compression;Breast massage  Audible Swallowing: A few with stimulation Intervention(s): Skin to skin;Hand expression;Alternate breast massage  Type of Nipple: Everted at rest and after stimulation  Comfort (Breast/Nipple): Filling, red/small blisters or bruises, mild/mod discomfort  Problem noted: Mild/Moderate discomfort Interventions (Mild/moderate discomfort): Hand massage;Hand expression  Hold  (Positioning): No assistance needed to correctly position infant at breast. Intervention(s): Breastfeeding basics reviewed;Support Pillows;Position options;Skin to skin  LATCH Score: 8  Lactation Tools Discussed/Used     Consult Status Consult Status: Follow-up Date: 10/30/14 Follow-up type: In-patient    Charyl Dancer 10/29/2014, 6:38 PM

## 2014-10-29 NOTE — Anesthesia Postprocedure Evaluation (Signed)
Anesthesia Post Note  Patient: Tammie Harris  Procedure(s) Performed: * No procedures listed *  Anesthesia type: Epidural  Patient location: Mother/Baby  Post pain: Pain level controlled  Post assessment: Post-op Vital signs reviewed  Last Vitals:  Filed Vitals:   10/29/14 0617  BP: 107/66  Pulse: 84  Temp: 36.6 C  Resp: 18    Post vital signs: Reviewed  Level of consciousness: awake  Complications: No apparent anesthesia complications

## 2014-10-30 NOTE — Discharge Summary (Signed)
Obstetric Discharge Summary Reason for Admission: induction of labor for post dates Prenatal Procedures: none Intrapartum Procedures: spontaneous vaginal delivery Postpartum Procedures: none Complications-Operative and Postpartum: 2nd degree perineal laceration  Delivery Note At 9:37 PM a viable female was delivered via Vaginal, Spontaneous Delivery (Presentation: Right Occiput Anterior). APGAR: 8, 9; weight pending.  Placenta status: Intact, Spontaneous. Cord: 3 vessels with the following complications: None.   Patient was noted to be completed dilated and +2 in station. She began pushing and delivered a female infant over intact perineum after several contractions. The anterior and posterior shoulder delivered without difficulty. The remainder of the baby was extracted from the vaginal and placed on mother's abdomen with nursing assistance. Delayed cord clamping was performed. The placenta delivered within several minutes of the infant. The uterus was massaged and noted to be firm. A 2nd degree posterior vaginal laceration was noted that required suturing. Good hemostasis was achieved. The uterus was massaged again and noted to be firm with minimal bleeding  Anesthesia: Epidural  Episiotomy: None Lacerations: 2nd degree vaginal laceration Suture Repair: 3.0 vicryl Est. Blood Loss (mL): 400  Hospital Course: Active Problems:   Post-dates pregnancy   Vaginal delivery  Tammie Harris is a 29 y.o. [id number] s/p NSVD after IOL for post dates.  Patient was admitted on 10/28/14.  She has postpartum course that was uncomplicated including no problems with ambulating, PO intake, urination, pain, or bleeding. The pt feels ready to go home and  will be discharged with outpatient follow-up.   Today: No acute events overnight.  Pt denies problems with ambulating, voiding or po intake.  She denies nausea or vomiting.  Pain is well controlled.  She has had flatus. She has not had bowel movement.   Lochia Small.  Plan for birth control is  oral contraceptives (estrogen/progesterone).  Method of Feeding: breast. Patient notes some supramammary gland swelling, non painful. Breastfeeding slowly improving  Physical Exam:  Filed Vitals:   10/30/14 0644  BP: 111/65  Pulse: 79  Temp: 98.8 F (37.1 C)  Resp: 18  General: alert, cooperative and no distress  Lungs: CTAB CV: RRR no m/r/g Breast: swollen supramammary glands bilaterally, non tender, no warmth Lochia: appropriate Uterine Fundus: firm DVT Evaluation: No cords or calf tenderness. No significant calf/ankle edema.  H/H: Lab Results  Component Value Date/Time   HGB 12.1 10/28/2014 10:28 PM   HCT 34.0* 10/28/2014 10:28 PM    Discharge Diagnoses: Post-date pregnancy delivered  Discharge Information: Date: 10/30/2014 Activity: pelvic rest Diet: routine  Medications: Ibuprofen Breast feeding:  Yes Condition: stable Instructions: refer to handout Discharge to: home   Discharge Instructions    Activity as tolerated    Complete by:  As directed      Call MD for:  severe uncontrolled pain    Complete by:  As directed      Call MD for:  temperature >100.4    Complete by:  As directed      Call MD for:    Complete by:  As directed   Significant bleeding     Diet - low sodium heart healthy    Complete by:  As directed      Discharge patient    Complete by:  As directed      Sexual acrtivity    Complete by:  As directed   Pelvic rest (no intercourse) until follow-up            Medication List    STOP taking these  medications        multivitamin-prenatal 27-0.8 MG Tabs tablet           Follow-up Information    Follow up with [facility] [place].   Why:  for 4-6 week postpartum visit   Contact information:   [address] [place] [address] [phone]      Fabio Asa ,MD OB Fellow 10/30/2014,7:20 AM

## 2014-10-30 NOTE — Lactation Note (Signed)
This note was copied from the chart of Tammie Dashonda Sorter. Lactation Consultation Note Follow up visit made prior to discharge.  Mom states nipples are sore and voicing concerns about milk supply.  Nipples with small abrasion on tips.  Reminded mom to wait for wide mouth and latch baby on deeply.  Breasts are filling.  Reviewed milk come to volume and engorgement treatment.  Outpatient lactation services reviewed and encouraged.  Patient Name: Tammie Harris KPTWS'F Date: 10/30/2014     Maternal Data    Feeding Length of feed: 30 min  LATCH Score/Interventions                      Lactation Tools Discussed/Used     Consult Status      Huston Foley 10/30/2014, 9:39 AM

## 2014-10-30 NOTE — Discharge Instructions (Signed)
Breastfeeding °Deciding to breastfeed is one of the best choices you can make for you and your baby. A change in hormones during pregnancy causes your breast tissue to grow and increases the number and size of your milk ducts. These hormones also allow proteins, sugars, and fats from your blood supply to make breast milk in your milk-producing glands. Hormones prevent breast milk from being released before your baby is born as well as prompt milk flow after birth. Once breastfeeding has begun, thoughts of your baby, as well as his or her sucking or crying, can stimulate the release of milk from your milk-producing glands.  °BENEFITS OF BREASTFEEDING °For Your Baby °· Your first milk (colostrum) helps your baby's digestive system function better.   °· There are antibodies in your milk that help your baby fight off infections.   °· Your baby has a lower incidence of asthma, allergies, and sudden infant death syndrome.   °· The nutrients in breast milk are better for your baby than infant formulas and are designed uniquely for your baby's needs.   °· Breast milk improves your baby's brain development.   °· Your baby is less likely to develop other conditions, such as childhood obesity, asthma, or type 2 diabetes mellitus.   °For You  °· Breastfeeding helps to create a very special bond between you and your baby.   °· Breastfeeding is convenient. Breast milk is always available at the correct temperature and costs nothing.   °· Breastfeeding helps to burn calories and helps you lose the weight gained during pregnancy.   °· Breastfeeding makes your uterus contract to its prepregnancy size faster and slows bleeding (lochia) after you give birth.   °· Breastfeeding helps to lower your risk of developing type 2 diabetes mellitus, osteoporosis, and breast or ovarian cancer later in life. °SIGNS THAT YOUR BABY IS HUNGRY °Early Signs of Hunger  °· Increased alertness or activity. °· Stretching. °· Movement of the head from  side to side. °· Movement of the head and opening of the mouth when the corner of the mouth or cheek is stroked (rooting). °· Increased sucking sounds, smacking lips, cooing, sighing, or squeaking. °· Hand-to-mouth movements. °· Increased sucking of fingers or hands. °Late Signs of Hunger °· Fussing. °· Intermittent crying. °Extreme Signs of Hunger °Signs of extreme hunger will require calming and consoling before your baby will be able to breastfeed successfully. Do not wait for the following signs of extreme hunger to occur before you initiate breastfeeding:   °· Restlessness. °· A loud, strong cry. °·  Screaming. °BREASTFEEDING BASICS °Breastfeeding Initiation °· Find a comfortable place to sit or lie down, with your neck and back well supported. °· Place a pillow or rolled up blanket under your baby to bring him or her to the level of your breast (if you are seated). Nursing pillows are specially designed to help support your arms and your baby while you breastfeed. °· Make sure that your baby's abdomen is facing your abdomen.   °· Gently massage your breast. With your fingertips, massage from your chest wall toward your nipple in a circular motion. This encourages milk flow. You may need to continue this action during the feeding if your milk flows slowly. °· Support your breast with 4 fingers underneath and your thumb above your nipple. Make sure your fingers are well away from your nipple and your baby's mouth.   °· Stroke your baby's lips gently with your finger or nipple.   °· When your baby's mouth is open wide enough, quickly bring your baby to your   breast, placing your entire nipple and as much of the colored area around your nipple (areola) as possible into your baby's mouth.   °¨ More areola should be visible above your baby's upper lip than below the lower lip.   °¨ Your baby's tongue should be between his or her lower gum and your breast.   °· Ensure that your baby's mouth is correctly positioned  around your nipple (latched). Your baby's lips should create a seal on your breast and be turned out (everted). °· It is common for your baby to suck about 2-3 minutes in order to start the flow of breast milk. °Latching °Teaching your baby how to latch on to your breast properly is very important. An improper latch can cause nipple pain and decreased milk supply for you and poor weight gain in your baby. Also, if your baby is not latched onto your nipple properly, he or she may swallow some air during feeding. This can make your baby fussy. Burping your baby when you switch breasts during the feeding can help to get rid of the air. However, teaching your baby to latch on properly is still the best way to prevent fussiness from swallowing air while breastfeeding. °Signs that your baby has successfully latched on to your nipple:    °· Silent tugging or silent sucking, without causing you pain.   °· Swallowing heard between every 3-4 sucks.   °·  Muscle movement above and in front of his or her ears while sucking.   °Signs that your baby has not successfully latched on to nipple:  °· Sucking sounds or smacking sounds from your baby while breastfeeding. °· Nipple pain. °If you think your baby has not latched on correctly, slip your finger into the corner of your baby's mouth to break the suction and place it between your baby's gums. Attempt breastfeeding initiation again. °Signs of Successful Breastfeeding °Signs from your baby:   °· A gradual decrease in the number of sucks or complete cessation of sucking.   °· Falling asleep.   °· Relaxation of his or her body.   °· Retention of a small amount of milk in his or her mouth.   °· Letting go of your breast by himself or herself. °Signs from you: °· Breasts that have increased in firmness, weight, and size 1-3 hours after feeding.   °· Breasts that are softer immediately after breastfeeding. °· Increased milk volume, as well as a change in milk consistency and color by  the fifth day of breastfeeding.   °· Nipples that are not sore, cracked, or bleeding. °Signs That Your Baby is Getting Enough Milk °· Wetting at least 3 diapers in a 24-hour period. The urine should be clear and pale yellow by age 5 days. °· At least 3 stools in a 24-hour period by age 5 days. The stool should be soft and yellow. °· At least 3 stools in a 24-hour period by age 7 days. The stool should be seedy and yellow. °· No loss of weight greater than 10% of birth weight during the first 3 days of age. °· Average weight gain of 4-7 ounces (113-198 g) per week after age 4 days. °· Consistent daily weight gain by age 5 days, without weight loss after the age of 2 weeks. °After a feeding, your baby may spit up a small amount. This is common. °BREASTFEEDING FREQUENCY AND DURATION °Frequent feeding will help you make more milk and can prevent sore nipples and breast engorgement. Breastfeed when you feel the need to reduce the fullness of your breasts   or when your baby shows signs of hunger. This is called "breastfeeding on demand." Avoid introducing a pacifier to your baby while you are working to establish breastfeeding (the first 4-6 weeks after your baby is born). After this time you may choose to use a pacifier. Research has shown that pacifier use during the first year of a baby's life decreases the risk of sudden infant death syndrome (SIDS). °Allow your baby to feed on each breast as long as he or she wants. Breastfeed until your baby is finished feeding. When your baby unlatches or falls asleep while feeding from the first breast, offer the second breast. Because newborns are often sleepy in the first few weeks of life, you may need to awaken your baby to get him or her to feed. °Breastfeeding times will vary from baby to baby. However, the following rules can serve as a guide to help you ensure that your baby is properly fed: °· Newborns (babies 4 weeks of age or younger) may breastfeed every 1-3  hours. °· Newborns should not go longer than 3 hours during the day or 5 hours during the night without breastfeeding. °· You should breastfeed your baby a minimum of 8 times in a 24-hour period until you begin to introduce solid foods to your baby at around 6 months of age. °BREAST MILK PUMPING °Pumping and storing breast milk allows you to ensure that your baby is exclusively fed your breast milk, even at times when you are unable to breastfeed. This is especially important if you are going back to work while you are still breastfeeding or when you are not able to be present during feedings. Your lactation consultant can give you guidelines on how long it is safe to store breast milk.  °A breast pump is a machine that allows you to pump milk from your breast into a sterile bottle. The pumped breast milk can then be stored in a refrigerator or freezer. Some breast pumps are operated by hand, while others use electricity. Ask your lactation consultant which type will work best for you. Breast pumps can be purchased, but some hospitals and breastfeeding support groups lease breast pumps on a monthly basis. A lactation consultant can teach you how to hand express breast milk, if you prefer not to use a pump.  °CARING FOR YOUR BREASTS WHILE YOU BREASTFEED °Nipples can become dry, cracked, and sore while breastfeeding. The following recommendations can help keep your breasts moisturized and healthy: °· Avoid using soap on your nipples.   °· Wear a supportive bra. Although not required, special nursing bras and tank tops are designed to allow access to your breasts for breastfeeding without taking off your entire bra or top. Avoid wearing underwire-style bras or extremely tight bras. °· Air dry your nipples for 3-4 minutes after each feeding.   °· Use only cotton bra pads to absorb leaked breast milk. Leaking of breast milk between feedings is normal.   °· Use lanolin on your nipples after breastfeeding. Lanolin helps to  maintain your skin's normal moisture barrier. If you use pure lanolin, you do not need to wash it off before feeding your baby again. Pure lanolin is not toxic to your baby. You may also hand express a few drops of breast milk and gently massage that milk into your nipples and allow the milk to air dry. °In the first few weeks after giving birth, some women experience extremely full breasts (engorgement). Engorgement can make your breasts feel heavy, warm, and tender to the   touch. Engorgement peaks within 3-5 days after you give birth. The following recommendations can help ease engorgement: °· Completely empty your breasts while breastfeeding or pumping. You may want to start by applying warm, moist heat (in the shower or with warm water-soaked hand towels) just before feeding or pumping. This increases circulation and helps the milk flow. If your baby does not completely empty your breasts while breastfeeding, pump any extra milk after he or she is finished. °· Wear a snug bra (nursing or regular) or tank top for 1-2 days to signal your body to slightly decrease milk production. °· Apply ice packs to your breasts, unless this is too uncomfortable for you. °· Make sure that your baby is latched on and positioned properly while breastfeeding. °If engorgement persists after 48 hours of following these recommendations, contact your health care provider or a lactation consultant. °OVERALL HEALTH CARE RECOMMENDATIONS WHILE BREASTFEEDING °· Eat healthy foods. Alternate between meals and snacks, eating 3 of each per day. Because what you eat affects your breast milk, some of the foods may make your baby more irritable than usual. Avoid eating these foods if you are sure that they are negatively affecting your baby. °· Drink milk, fruit juice, and water to satisfy your thirst (about 10 glasses a day).   °· Rest often, relax, and continue to take your prenatal vitamins to prevent fatigue, stress, and anemia. °· Continue  breast self-awareness checks. °· Avoid chewing and smoking tobacco. °· Avoid alcohol and drug use. °Some medicines that may be harmful to your baby can pass through breast milk. It is important to ask your health care provider before taking any medicine, including all over-the-counter and prescription medicine as well as vitamin and herbal supplements. °It is possible to become pregnant while breastfeeding. If birth control is desired, ask your health care provider about options that will be safe for your baby. °SEEK MEDICAL CARE IF:  °· You feel like you want to stop breastfeeding or have become frustrated with breastfeeding. °· You have painful breasts or nipples. °· Your nipples are cracked or bleeding. °· Your breasts are red, tender, or warm. °· You have a swollen area on either breast. °· You have a fever or chills. °· You have nausea or vomiting. °· You have drainage other than breast milk from your nipples. °· Your breasts do not become full before feedings by the fifth day after you give birth. °· You feel sad and depressed. °· Your baby is too sleepy to eat well. °· Your baby is having trouble sleeping.   °· Your baby is wetting less than 3 diapers in a 24-hour period. °· Your baby has less than 3 stools in a 24-hour period. °· Your baby's skin or the white part of his or her eyes becomes yellow.   °· Your baby is not gaining weight by 5 days of age. °SEEK IMMEDIATE MEDICAL CARE IF:  °· Your baby is overly tired (lethargic) and does not want to wake up and feed. °· Your baby develops an unexplained fever. °Document Released: 05/03/2005 Document Revised: 05/08/2013 Document Reviewed: 10/25/2012 °[employer]® Patient Information ©2015 [employer]. This information is not intended to replace advice given to you by your health care provider. Make sure you discuss any questions you have with your health care provider. °Vaginal Delivery, Care After °Refer to this sheet in the next few weeks. These discharge  instructions provide you with information on caring for yourself after delivery. Your caregiver may also give you specific instructions.   Your treatment has been planned according to the most current medical practices available, but problems sometimes occur. Call your caregiver if you have any problems or questions after you go home. °HOME CARE INSTRUCTIONS °· Take over-the-counter or prescription medicines only as directed by your caregiver or pharmacist. °· Do not drink alcohol, especially if you are breastfeeding or taking medicine to relieve pain. °· Do not chew or smoke tobacco. °· Do not use illegal drugs. °· Continue to use good perineal care. Good perineal care includes: °· Wiping your perineum from front to back. °· Keeping your perineum clean. °· Do not use tampons or douche until your caregiver says it is okay. °· Shower, wash your hair, and take tub baths as directed by your caregiver. °· Wear a well-fitting bra that provides breast support. °· Eat healthy foods. °· Drink enough fluids to keep your urine clear or pale yellow. °· Eat high-fiber foods such as whole grain cereals and breads, brown rice, beans, and fresh fruits and vegetables every day. These foods may help prevent or relieve constipation. °· Follow your caregiver's recommendations regarding resumption of activities such as climbing stairs, driving, lifting, exercising, or traveling. °· Talk to your caregiver about resuming sexual activities. Resumption of sexual activities is dependent upon your risk of infection, your rate of healing, and your comfort and desire to resume sexual activity. °· Try to have someone help you with your household activities and your newborn for at least a few days after you leave the hospital. °· Rest as much as possible. Try to rest or take a nap when your newborn is sleeping. °· Increase your activities gradually. °· Keep all of your scheduled postpartum appointments. It is very important to keep your scheduled  follow-up appointments. At these appointments, your caregiver will be checking to make sure that you are healing physically and emotionally. °SEEK MEDICAL CARE IF:  °· You are passing large clots from your vagina. Save any clots to show your caregiver. °· You have a foul smelling discharge from your vagina. °· You have trouble urinating. °· You are urinating frequently. °· You have pain when you urinate. °· You have a change in your bowel movements. °· You have increasing redness, pain, or swelling near your vaginal incision (episiotomy) or vaginal tear. °· You have pus draining from your episiotomy or vaginal tear. °· Your episiotomy or vaginal tear is separating. °· You have painful, hard, or reddened breasts. °· You have a severe headache. °· You have blurred vision or see spots. °· You feel sad or depressed. °· You have thoughts of hurting yourself or your newborn. °· You have questions about your care, the care of your newborn, or medicines. °· You are dizzy or light-headed. °· You have a rash. °· You have nausea or vomiting. °· You were breastfeeding and have not had a menstrual period within 12 weeks after you stopped breastfeeding. °· You are not breastfeeding and have not had a menstrual period by the 12th week after delivery. °· You have a fever. °SEEK IMMEDIATE MEDICAL CARE IF:  °· You have persistent pain. °· You have chest pain. °· You have shortness of breath. °· You faint. °· You have leg pain. °· You have stomach pain. °· Your vaginal bleeding saturates two or more sanitary pads in 1 hour. °MAKE SURE YOU:  °· Understand these instructions. °· Will watch your condition. °· Will get help right away if you are not doing well or get worse. °Document Released: 04/30/2000   Document Revised: 09/17/2013 Document Reviewed: 12/29/2011 °[employer]® Patient Information ©2015 [employer]. This information is not intended to replace advice given to you by your health care provider. Make sure you discuss any  questions you have with your health care provider. ° °

## 2015-06-16 IMAGING — US US OB FOLLOW-UP
1 series · 12 of 28 positions shown · non-contrast
Comparison: none

[Series 1: us ob follow-up · 0.21mm/px · 63 acquisitions, 12 frames shown]
[im 3/63]
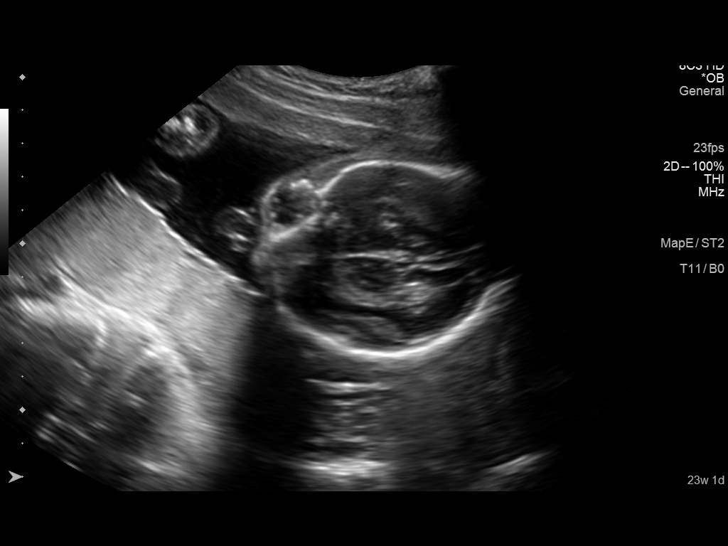
[im 7/63]
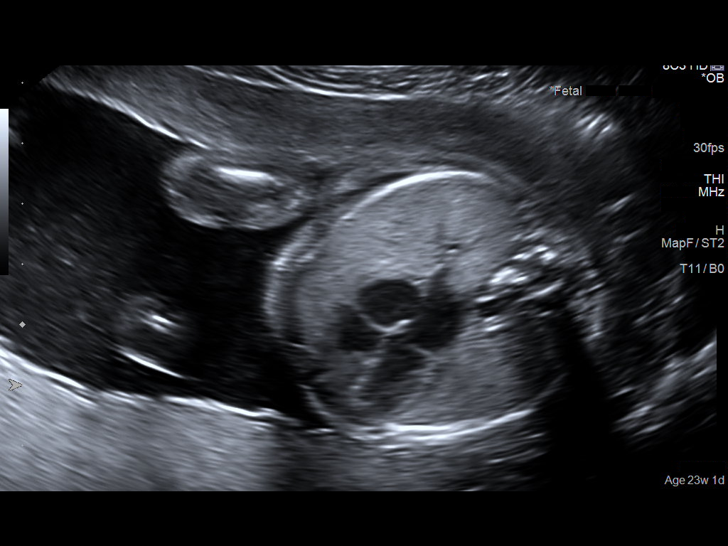
[im 12/63]
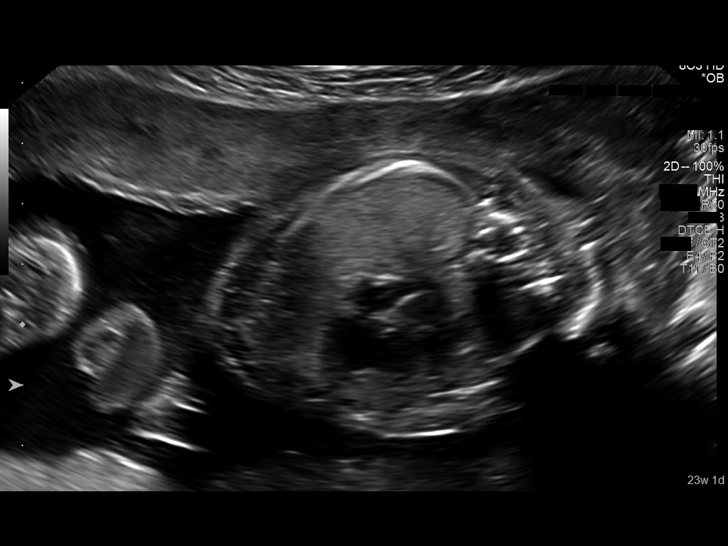
[im 19/63]
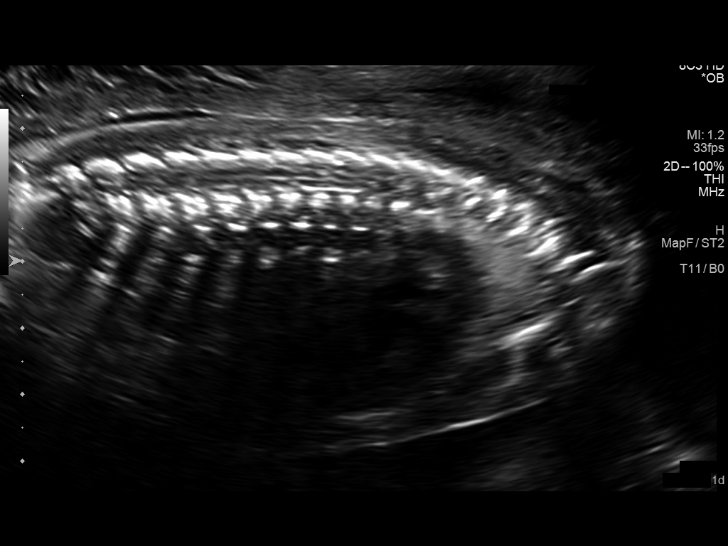
[im 23/63]
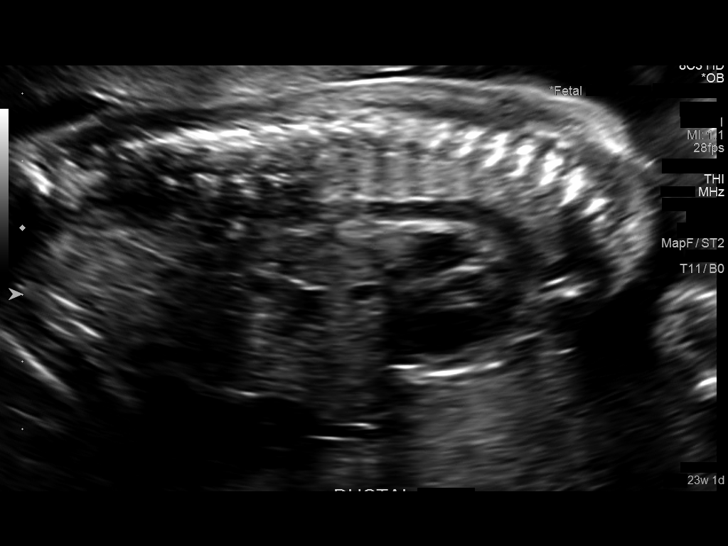
[im 28/63]
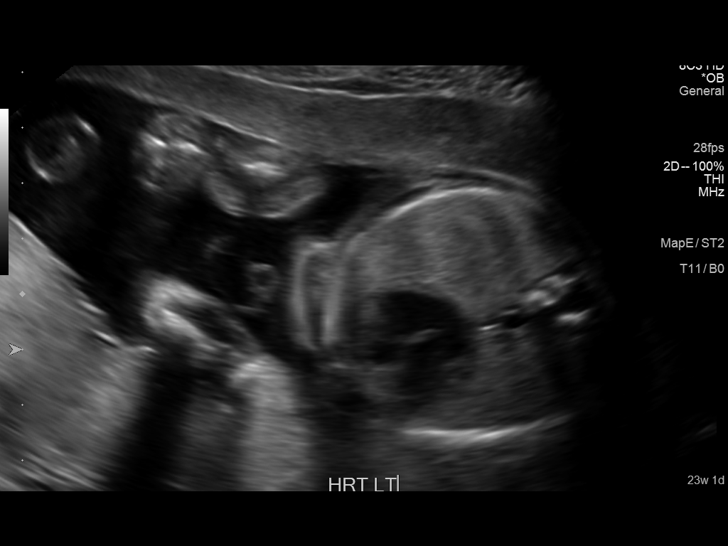
[im 35/63]
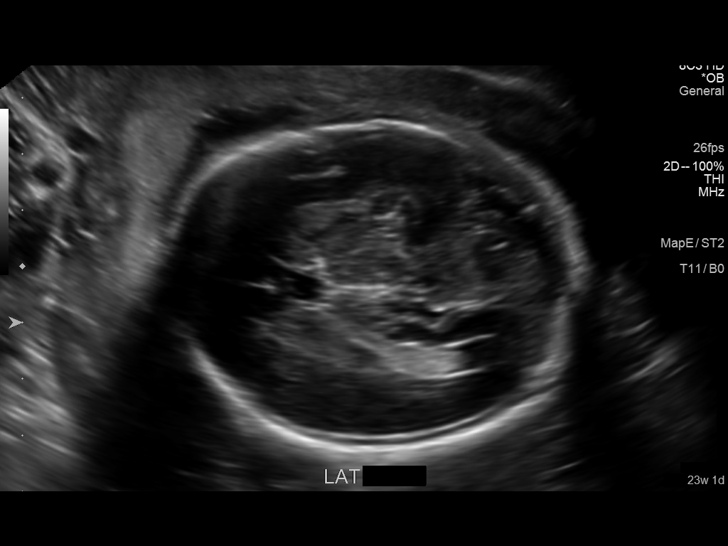
[im 40/63]
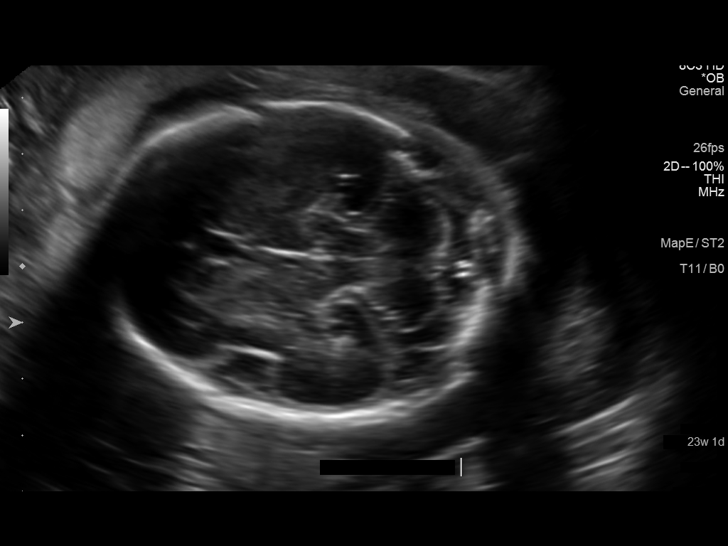
[im 44/63]
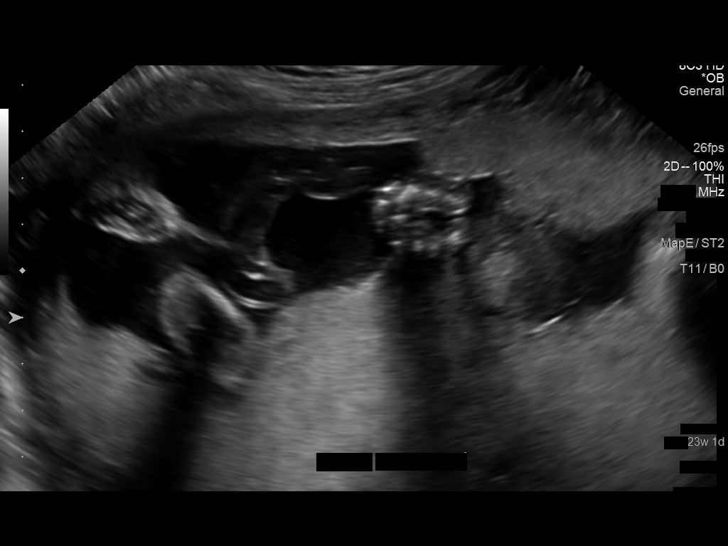
[im 51/63]
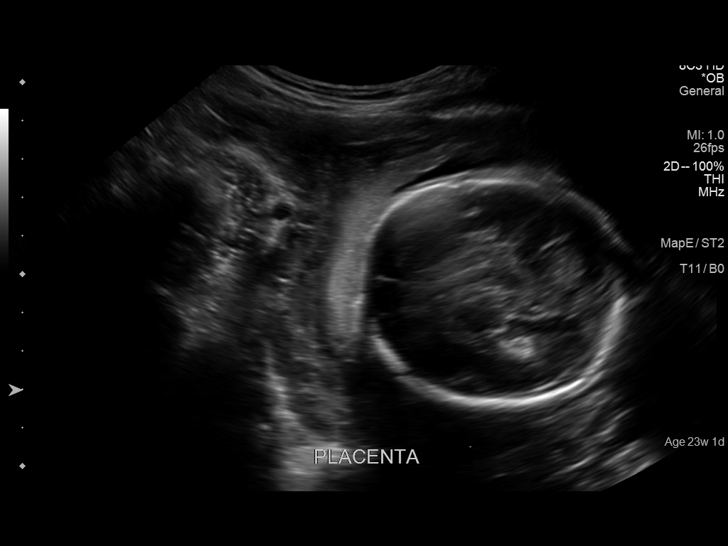
[im 56/63]
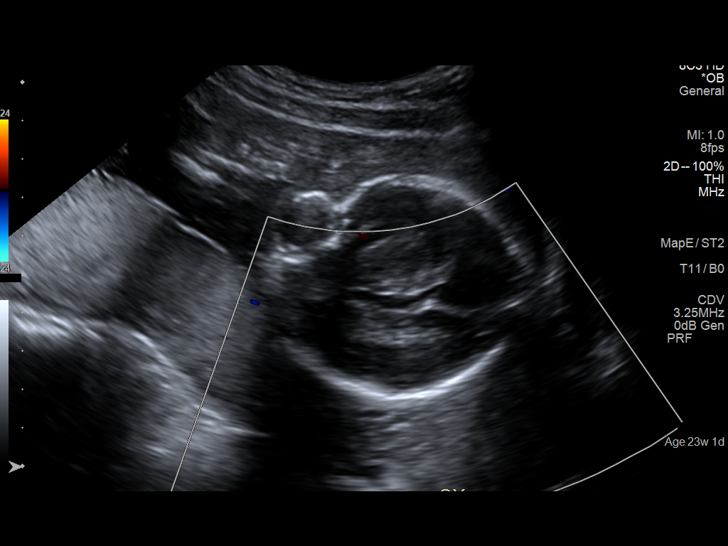
[im 60/63]
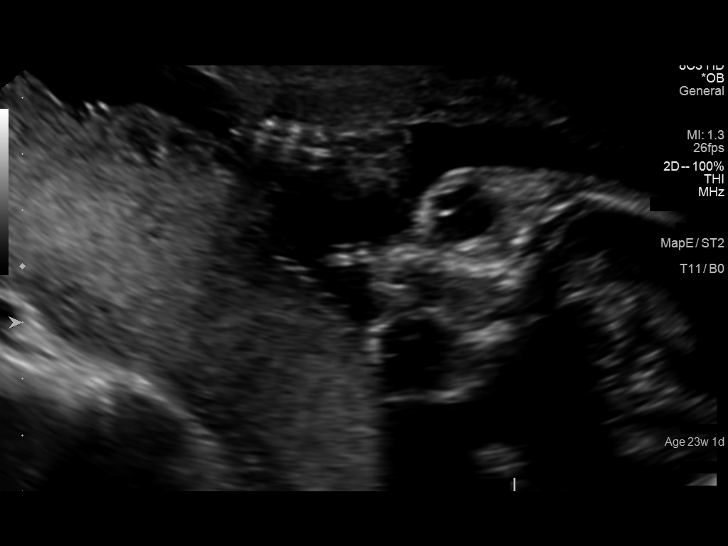

[12 of 28 positions shown; findings below may reference images not displayed]

OBSTETRICS REPORT
                      (Signed Final 06/27/2014 [DATE])

Service(s) Provided

 US OB FOLLOW UP                                       76816.1
Indications

 Follow-up incomplete fetal anatomic evaluation        Z36
 23 weeks gestation of pregnancy
Fetal Evaluation

 Num Of Fetuses:    1
 Fetal Heart Rate:  149                          bpm
 Cardiac Activity:  Observed
 Presentation:      Cephalic
 Placenta:          Posterior, above cervical
                    os
 P. Cord            Previously Visualized
 Insertion:

 Amniotic Fluid
 AFI FV:      Subjectively within normal limits
                                             Larg Pckt:     4.5  cm
Biometry

 BPD:       57  mm     G. Age:  23w 3d                CI:        69.37   70 - 86
                                                      FL/HC:      19.9   19.2 -

 HC:     218.5  mm     G. Age:  23w 6d       65  %    HC/AC:      1.22   1.05 -

 AC:     178.4  mm     G. Age:  22w 5d       29  %    FL/BPD:     76.1   71 - 87
 FL:      43.4  mm     G. Age:  24w 2d       74  %    FL/AC:      24.3   20 - 24

 Est. FW:     595  gm      1 lb 5 oz     58  %
Gestational Age

 LMP:           22w 3d        Date:  01/21/14                 EDD:   10/28/14
 U/S Today:     23w 4d                                        EDD:   10/20/14
 Best:          23w 1d     Det. By:  U/S (05/30/14)           EDD:   10/23/14
Anatomy

 Cranium:          Appears normal         Aortic Arch:      Appears normal
 Fetal Cavum:      Appears normal         Ductal Arch:      Appears normal
 Ventricles:       Appears normal         Diaphragm:        Appears normal
 Choroid Plexus:   Appears normal         Stomach:          Appears normal, left
                                                            sided
 Cerebellum:       Appears normal         Abdomen:          Appears normal
 Posterior Fossa:  Not well visualized    Abdominal Wall:   Appears nml (cord
                                                            insert, abd wall)
 Nuchal Fold:      Previously seen        Cord Vessels:     Previously seen
 Face:             Orbits and profile     Kidneys:          Appear normal
                   previously seen
 Lips:             Appears normal         Bladder:          Appears normal
 Heart:            Appears normal         Spine:            Previously seen
                   (4CH, axis, and
                   situs)
 RVOT:             Appears normal         Lower             Previously seen
                                          Extremities:
 LVOT:             Appears normal         Upper             Previously seen
                                          Extremities:

 Other:  Fetus appears to be a female.Heels and 5th digit previously
         visualized. Technically difficult due to fetal position.
Cervix Uterus Adnexa

 Cervical Length:    3.3      cm

 Cervix:       Normal appearance by transabdominal scan.

 Adnexa:     Not visualized
Impression

 Single IUP at 23w 1d
 Normal interval anatomy.  The anatomic fetal survey is now
 complete.
 Fetal growth is appropriate (58th %tile)
 Normal amniotic fluid volume
Recommendations

 Follow-up ultrasounds as clinically indicated.

## 2018-02-16 ENCOUNTER — Ambulatory Visit
Admission: RE | Admit: 2018-02-16 | Discharge: 2018-02-16 | Disposition: A | Discharge status: Home / Self Care | Payer: 59 | Source: Ambulatory Visit | Attending: Internal Medicine | Admitting: Internal Medicine

## 2018-02-16 ENCOUNTER — Other Ambulatory Visit: Payer: Self-pay | Admitting: Internal Medicine

## 2018-02-16 DIAGNOSIS — A150 Tuberculosis of lung: Secondary | ICD-10-CM

## 2018-07-14 ENCOUNTER — Other Ambulatory Visit (HOSPITAL_COMMUNITY): Payer: Self-pay | Admitting: Family

## 2018-07-14 DIAGNOSIS — Z3A13 13 weeks gestation of pregnancy: Secondary | ICD-10-CM

## 2018-07-14 DIAGNOSIS — Z369 Encounter for antenatal screening, unspecified: Secondary | ICD-10-CM

## 2018-07-25 ENCOUNTER — Encounter (HOSPITAL_COMMUNITY): Payer: Self-pay

## 2018-07-28 ENCOUNTER — Encounter (HOSPITAL_COMMUNITY): Payer: Self-pay | Admitting: *Deleted

## 2018-08-01 ENCOUNTER — Other Ambulatory Visit: Payer: Self-pay

## 2018-08-01 ENCOUNTER — Ambulatory Visit (HOSPITAL_COMMUNITY): Payer: 59 | Admitting: *Deleted

## 2018-08-01 ENCOUNTER — Other Ambulatory Visit (HOSPITAL_COMMUNITY): Payer: Self-pay | Admitting: *Deleted

## 2018-08-01 ENCOUNTER — Ambulatory Visit (HOSPITAL_COMMUNITY)
Admission: RE | Admit: 2018-08-01 | Discharge: 2018-08-01 | Disposition: A | Discharge status: Home / Self Care | Payer: 59 | Source: Ambulatory Visit | Attending: Obstetrics and Gynecology | Admitting: Obstetrics and Gynecology

## 2018-08-01 ENCOUNTER — Encounter (HOSPITAL_COMMUNITY): Payer: Self-pay

## 2018-08-01 ENCOUNTER — Other Ambulatory Visit (HOSPITAL_COMMUNITY): Payer: Self-pay | Admitting: Family

## 2018-08-01 ENCOUNTER — Ambulatory Visit (HOSPITAL_COMMUNITY): Payer: 59

## 2018-08-01 VITALS — BP 104/57 | HR 77 | Temp 97.6°F | Wt 147.0 lb

## 2018-08-01 DIAGNOSIS — Z3A15 15 weeks gestation of pregnancy: Secondary | ICD-10-CM | POA: Diagnosis not present

## 2018-08-01 DIAGNOSIS — Z363 Encounter for antenatal screening for malformations: Secondary | ICD-10-CM

## 2018-08-01 DIAGNOSIS — Z369 Encounter for antenatal screening, unspecified: Secondary | ICD-10-CM

## 2018-08-01 DIAGNOSIS — Z3682 Encounter for antenatal screening for nuchal translucency: Secondary | ICD-10-CM | POA: Diagnosis not present

## 2018-08-01 DIAGNOSIS — Z3A13 13 weeks gestation of pregnancy: Secondary | ICD-10-CM | POA: Diagnosis not present

## 2018-08-01 DIAGNOSIS — Z362 Encounter for other antenatal screening follow-up: Secondary | ICD-10-CM

## 2018-08-29 ENCOUNTER — Ambulatory Visit (HOSPITAL_COMMUNITY)
Admission: RE | Admit: 2018-08-29 | Discharge: 2018-08-29 | Disposition: A | Discharge status: Home / Self Care | Payer: 59 | Source: Ambulatory Visit | Attending: Obstetrics and Gynecology | Admitting: Obstetrics and Gynecology

## 2018-08-29 ENCOUNTER — Other Ambulatory Visit: Payer: Self-pay

## 2018-08-29 DIAGNOSIS — Z362 Encounter for other antenatal screening follow-up: Secondary | ICD-10-CM | POA: Diagnosis present

## 2018-08-29 DIAGNOSIS — Z3A19 19 weeks gestation of pregnancy: Secondary | ICD-10-CM | POA: Diagnosis not present

## 2019-04-14 ENCOUNTER — Encounter (HOSPITAL_COMMUNITY): Payer: Self-pay

## 2019-08-18 IMAGING — US US MFM OB FOLLOW UP
1 series · 13 of 28 positions shown · non-contrast
Comparison: none

[Series 1: us mfm ob follow up · 69 acquisitions, 13 frames shown]
[im 3/69]
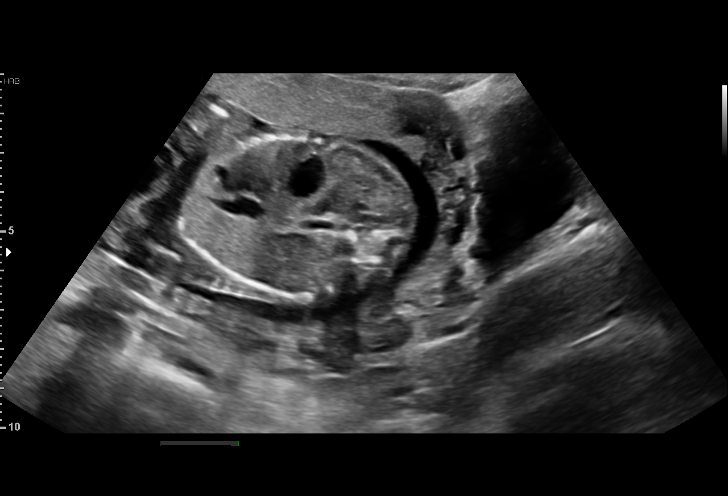
[im 8/69]
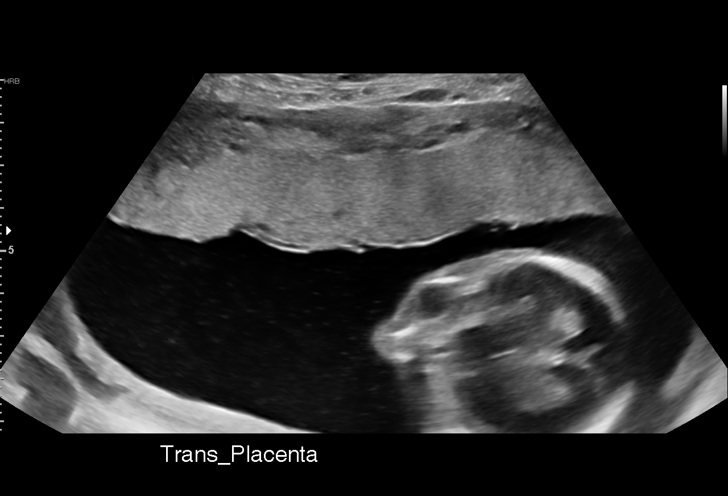
[im 13/69]
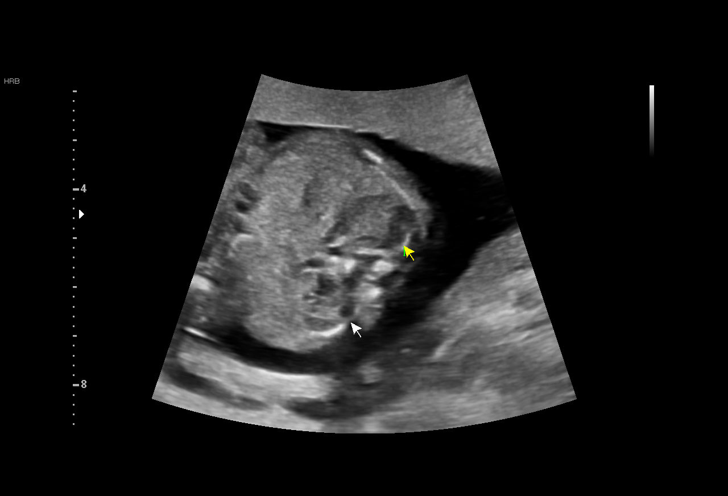
[im 18/69]
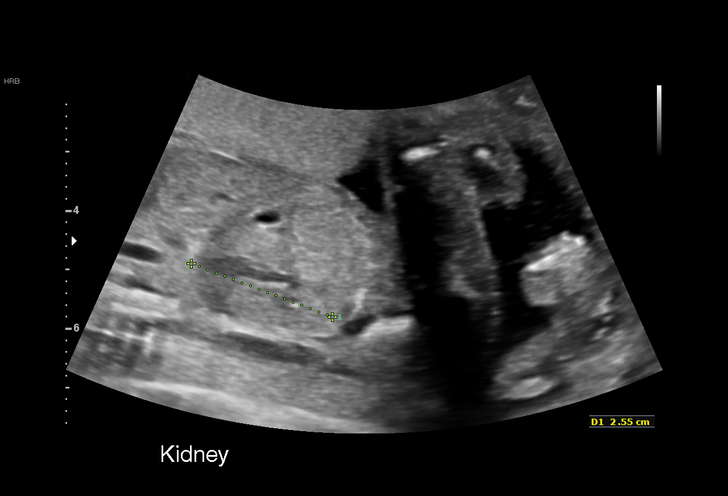
[im 23/69]
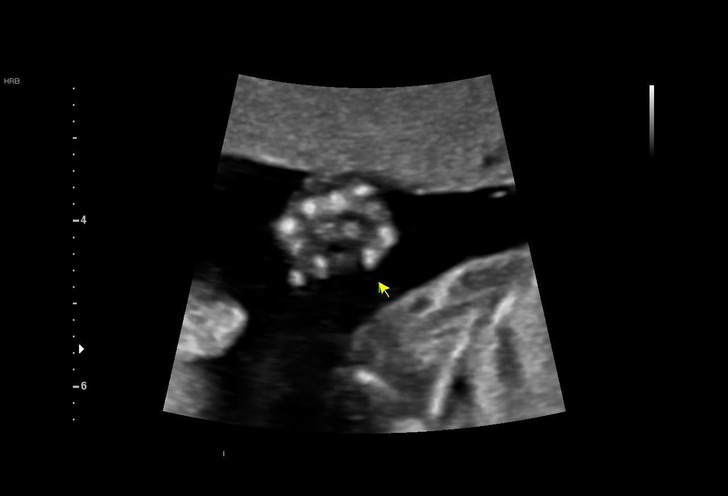
[im 28/69]
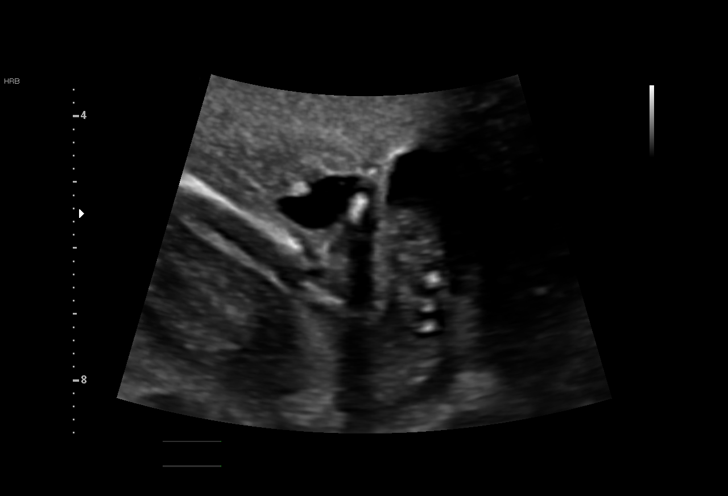
[im 36/69]
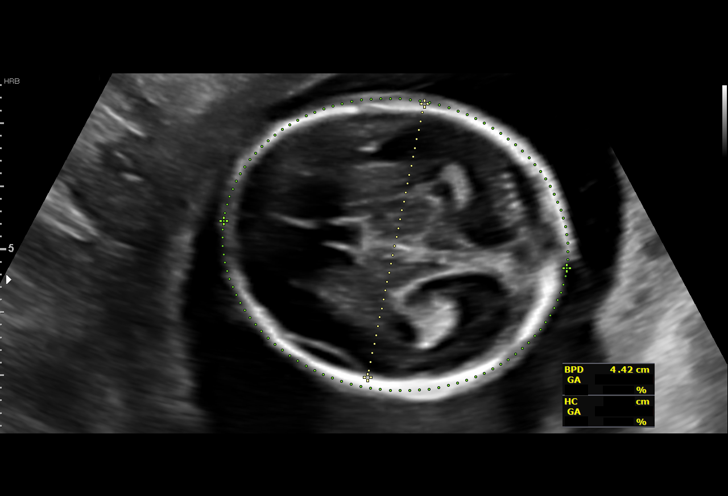
[im 41/69]
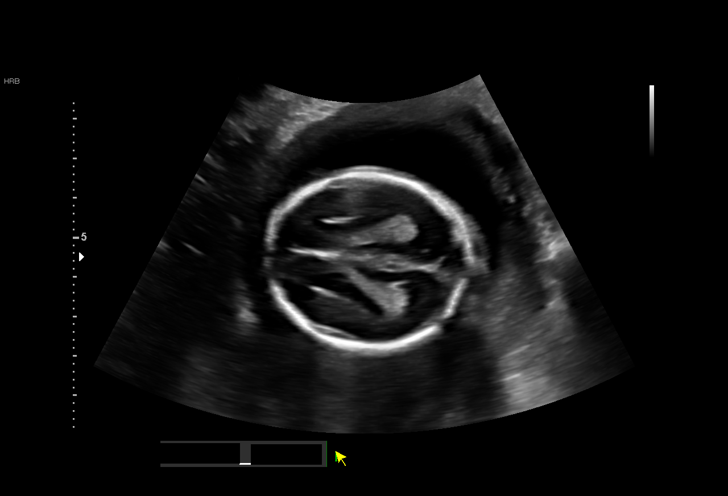
[im 46/69]
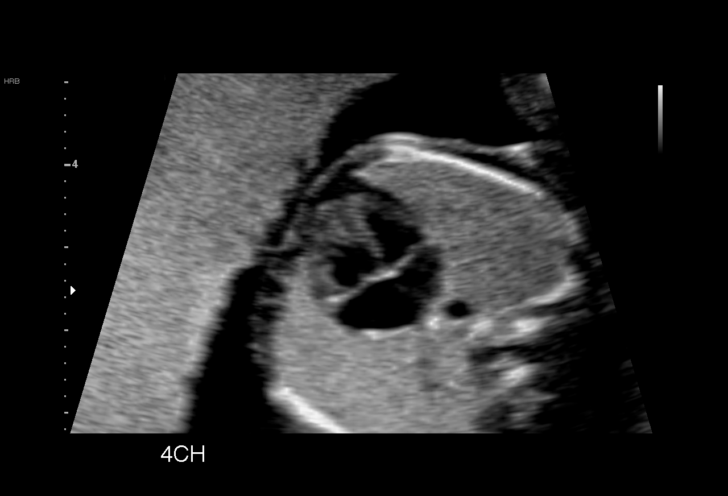
[im 51/69]
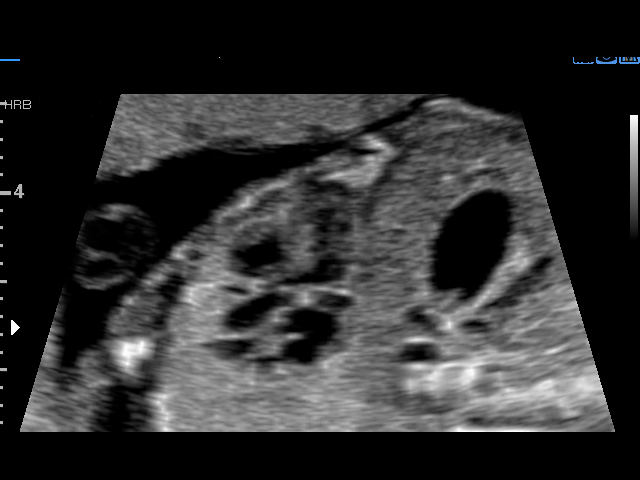
[im 56/69]
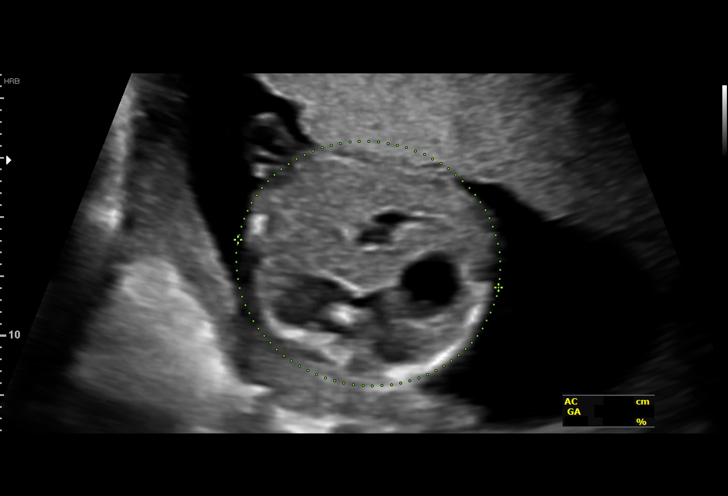
[im 61/69]
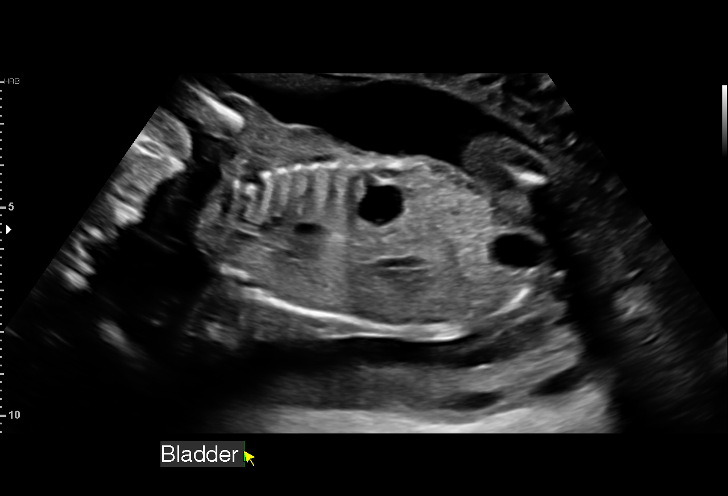
[im 66/69]
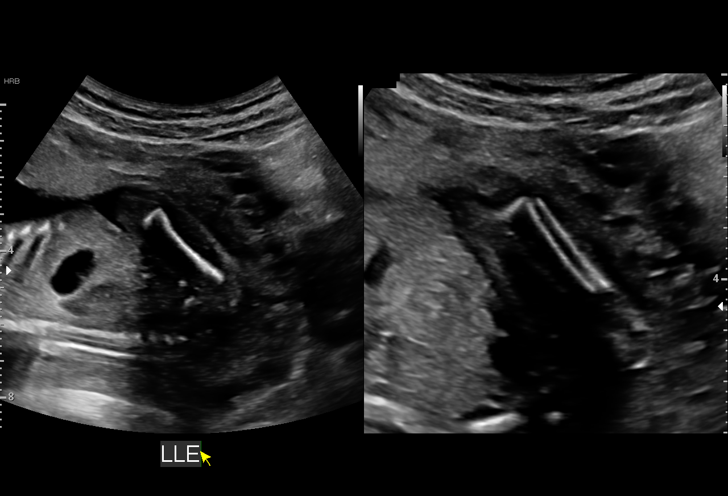

[13 of 28 positions shown; findings below may reference images not displayed]

----------------------------------------------------------------------

 ----------------------------------------------------------------------
Indications

  Encounter for other antenatal screening
  follow-up
  19 weeks gestation of pregnancy
 ----------------------------------------------------------------------
Vital Signs

 BMI:
Fetal Evaluation

 Num Of Fetuses:         1
 Fetal Heart Rate(bpm):  153
 Cardiac Activity:       Observed
 Presentation:           Breech
 Placenta:               Anterior
 P. Cord Insertion:      Previously Visualized

 Amniotic Fluid
 AFI FV:      Within normal limits

                             Largest Pocket(cm)

Biometry

 BPD:      44.3  mm     G. Age:  19w 3d         49  %    CI:         78.9   %    70 - 86
                                                         FL/HC:      17.4   %    16.1 -
 HC:      157.7  mm     G. Age:  18w 5d         12  %    HC/AC:      1.18        1.09 -
 AC:      133.5  mm     G. Age:  18w 6d         26  %    FL/BPD:     62.1   %
 FL:       27.5  mm     G. Age:  18w 3d         13  %    FL/AC:      20.6   %    20 - 24
 HUM:      25.9  mm     G. Age:  18w 1d         17  %
 CER:      20.3  mm     G. Age:  19w 2d         48  %
 NFT:       3.5  mm
 LV:        7.6  mm
 CM:        2.6  mm
 Est. FW:     251  gm      0 lb 9 oz     32  %
OB History

 Gravidity:    3         Term:   2
 Living:       2
Gestational Age

 LMP:           17w 3d        Date:  04/29/18                 EDD:   02/03/19
 U/S Today:     18w 6d                                        EDD:   01/24/19
 Best:          19w 3d     Det. By:  U/S  (08/01/18)          EDD:   01/20/19
Anatomy

 Cranium:               Appears normal         Aortic Arch:            Previously seen
 Cavum:                 Appears normal         Ductal Arch:            Previously seen
 Ventricles:            Appears normal         Diaphragm:              Appears normal
 Choroid Plexus:        Previously seen        Stomach:                Appears normal, left
                                                                       sided
 Cerebellum:            Appears normal         Abdomen:                Appears normal
 Posterior Fossa:       Appears normal         Abdominal Wall:         Previously seen
 Nuchal Fold:           Appears normal         Cord Vessels:           Previously seen
 Face:                  Appears normal         Kidneys:                Appear normal
                        (orbits and profile)
 Lips:                  Previously seen        Bladder:                Appears normal
 Thoracic:              Appears normal         Spine:                  Previously seen
 Heart:                 Appears normal         Upper Extremities:      Appears normal
                        (4CH, axis, and
                        situs)
 RVOT:                  Appears normal         Lower Extremities:      Appears normal
 LVOT:                  Appears normal

 Other:  Hands and feet previously visualized. Male gender. Heels and 5th
         digit visualized. Nasal bone visualized. Open hands visualized.
Cervix Uterus Adnexa

 Cervix
 Length:           3.09  cm.
 Normal appearance by transabdominal scan.

 Uterus
 No abnormality visualized.
Impression

 We performed a fetal anatomy scan. No markers of
 aneuploidies or fetal structural defects are seen. Fetal
 biometry is consistent with her previously-established dates.
 Amniotic fluid is normal and good fetal activity is seen.
 Patient understands the limitations of ultrasound in detecting
 fetal anomalies.

 We were unable to obtain nuchal-translucency
 measurements in the first trimester. Patient reports she had
 blood work at your office later (?quad screening). I asked her
 to contact your office for the results.
 Blood can be drawn for quad screening up to 21 weeks
 ([REDACTED]).
Recommendations

 Follow-up as clinically indicated.
                 Onacram, Don Lolito

## 2019-11-22 LAB — HEPATITIS B SURFACE ANTIGEN W/ REFLEX TO CONFIRMATION: Hepatitis B Surface Antigen: NEGATIVE

## 2019-11-22 LAB — RPR: RPR: NONREACTIVE

## 2019-11-22 LAB — HIV AG/AB 4TH GENERATION: HIV Ag/Ab, 4th Generation: NONREACTIVE

## 2019-11-22 LAB — RUBELLA ANTIBODY, IGG: Rubella AB, IgG: NON-IMMUNE/NOT IMMUNE

## 2020-04-21 ENCOUNTER — Encounter: Payer: Self-pay | Admitting: Obstetrics & Gynecology

## 2020-04-29 LAB — GROUP B STREP TRANSCRIBED: GBS Transcribed: NEGATIVE

## 2020-05-17 NOTE — L&D Delivery Note (Signed)
OB DELIVERY NOTE    Date Time: 05/22/20 5:37 PM  Patient Name:   Amber Caldwell    Providers Performing:   Donavan Burnet, MD      Operative Procedure:   spontaneous vaginal delivery    Findings:   Position - OA      Baby: Liveborn Female ; Apgar 1 minute: 8  Apgar 5 minute: 9 ; Birth weight: 7 lb 15.3 oz (3610 g)   Repair- periurethral tear and first degree median tear  Placenta:expressed intact  Three vessel cord:yes.      Estimated Blood Loss:   25    Blood Replacement   NONE    Complications:   NONE    Condition:   STABLE  The sponge and instrument counts were correct *2          Signed by: Dutch Quint, MD, MD

## 2020-05-21 ENCOUNTER — Inpatient Hospital Stay
Admission: RE | Admit: 2020-05-21 | Discharge: 2020-05-24 | DRG: 560 | Disposition: A | Discharge status: Home / Self Care | Payer: Medicaid HMO | Source: Ambulatory Visit | Attending: Obstetrics & Gynecology | Admitting: Obstetrics & Gynecology

## 2020-05-21 ENCOUNTER — Encounter: Payer: Self-pay | Admitting: Obstetrics & Gynecology

## 2020-05-21 DIAGNOSIS — O480 Post-term pregnancy: Secondary | ICD-10-CM | POA: Diagnosis present

## 2020-05-21 DIAGNOSIS — Z3A40 40 weeks gestation of pregnancy: Secondary | ICD-10-CM

## 2020-05-21 DIAGNOSIS — O4202 Full-term premature rupture of membranes, onset of labor within 24 hours of rupture: Principal | ICD-10-CM | POA: Diagnosis present

## 2020-05-21 LAB — CBC AND DIFFERENTIAL
Absolute NRBC: 0 10*3/uL (ref 0.00–0.00)
Basophils Absolute Automated: 0.01 10*3/uL (ref 0.00–0.08)
Basophils Automated: 0.1 %
Eosinophils Absolute Automated: 0.16 10*3/uL (ref 0.00–0.44)
Eosinophils Automated: 1.9 %
Hematocrit: 36.9 % (ref 34.7–43.7)
Hgb: 12.3 g/dL (ref 11.4–14.8)
Immature Granulocytes Absolute: 0.05 10*3/uL (ref 0.00–0.07)
Immature Granulocytes: 0.6 %
Lymphocytes Absolute Automated: 2.02 10*3/uL (ref 0.42–3.22)
Lymphocytes Automated: 24.6 %
MCH: 27.8 pg (ref 25.1–33.5)
MCHC: 33.3 g/dL (ref 31.5–35.8)
MCV: 83.5 fL (ref 78.0–96.0)
MPV: 11 fL (ref 8.9–12.5)
Monocytes Absolute Automated: 0.71 10*3/uL (ref 0.21–0.85)
Monocytes: 8.6 %
Neutrophils Absolute: 5.26 10*3/uL (ref 1.10–6.33)
Neutrophils: 64.2 %
Nucleated RBC: 0 /100 WBC (ref 0.0–0.0)
Platelets: 202 10*3/uL (ref 142–346)
RBC: 4.42 10*6/uL (ref 3.90–5.10)
RDW: 15 % (ref 11–15)
WBC: 8.21 10*3/uL (ref 3.10–9.50)

## 2020-05-21 LAB — ABO/RH: ABO Rh: O POS

## 2020-05-21 LAB — TYPE AND SCREEN
AB Screen Gel: NEGATIVE
ABO Rh: O POS

## 2020-05-21 MED ORDER — LIDOCAINE HCL (PF) 1 % IJ SOLN
10.0000 mL | INTRAMUSCULAR | Status: DC
Start: 2020-05-21 — End: 2020-05-22

## 2020-05-21 MED ORDER — CEFAZOLIN SODIUM-DEXTROSE 2-5 GM/50ML-% IV SOLN
2.0000 g | Freq: Once | INTRAVENOUS | Status: DC | PRN
Start: 2020-05-21 — End: 2020-05-22

## 2020-05-21 MED ORDER — LACTATED RINGERS IV SOLN
INTRAVENOUS | Status: DC
Start: 2020-05-21 — End: 2020-05-22

## 2020-05-21 MED ORDER — NALOXONE HCL 0.4 MG/ML IJ SOLN (WRAP)
0.2000 mg | INTRAMUSCULAR | Status: DC | PRN
Start: 2020-05-21 — End: 2020-05-22

## 2020-05-21 MED ORDER — ACETAMINOPHEN 650 MG RE SUPP
650.0000 mg | RECTAL | Status: DC | PRN
Start: 2020-05-21 — End: 2020-05-22

## 2020-05-21 MED ORDER — TERBUTALINE SULFATE 1 MG/ML IJ SOLN
0.2500 mg | Freq: Once | INTRAMUSCULAR | Status: DC | PRN
Start: 2020-05-21 — End: 2020-05-22

## 2020-05-21 MED ORDER — SODIUM CHLORIDE 0.9 % IV SOLN
6.2500 mg | Freq: Four times a day (QID) | INTRAVENOUS | Status: DC | PRN
Start: 2020-05-21 — End: 2020-05-22

## 2020-05-21 MED ORDER — ONDANSETRON 4 MG PO TBDP
4.0000 mg | ORAL_TABLET | Freq: Three times a day (TID) | ORAL | Status: DC | PRN
Start: 2020-05-21 — End: 2020-05-22

## 2020-05-21 MED ORDER — OXYTOCIN-SODIUM CHLORIDE 30-0.9 UT/500ML-% IV SOLN
7.5000 [IU]/h | INTRAVENOUS | Status: DC | PRN
Start: 2020-05-22 — End: 2020-05-22
  Administered 2020-05-22: 17:00:00 7.5 [IU]/h via INTRAVENOUS

## 2020-05-21 MED ORDER — PROMETHAZINE HCL 25 MG PO TABS
12.5000 mg | ORAL_TABLET | Freq: Four times a day (QID) | ORAL | Status: DC | PRN
Start: 2020-05-21 — End: 2020-05-22

## 2020-05-21 MED ORDER — ACETAMINOPHEN 325 MG PO TABS
650.0000 mg | ORAL_TABLET | ORAL | Status: DC | PRN
Start: 2020-05-21 — End: 2020-05-22

## 2020-05-21 MED ORDER — PROMETHAZINE HCL 25 MG RE SUPP
12.5000 mg | Freq: Four times a day (QID) | RECTAL | Status: DC | PRN
Start: 2020-05-21 — End: 2020-05-22

## 2020-05-21 MED ORDER — ONDANSETRON HCL 4 MG/2ML IJ SOLN
4.0000 mg | Freq: Three times a day (TID) | INTRAMUSCULAR | Status: DC | PRN
Start: 2020-05-21 — End: 2020-05-22

## 2020-05-21 MED ORDER — OXYTOCIN-SODIUM CHLORIDE 30-0.9 UT/500ML-% IV SOLN
2.0000 m[IU]/min | INTRAVENOUS | Status: DC | PRN
Start: 2020-05-21 — End: 2020-05-22
  Administered 2020-05-22: 2 m[IU]/min via INTRAVENOUS
  Filled 2020-05-21 (×2): qty 500

## 2020-05-21 NOTE — Progress Notes (Signed)
Pt presents to Labor and Delivery ambulatory from home with complaints of occasional contractions and SROM at 1500.  Denies vaginal bleeding and states positive fetal movement.  Assisted to triage bay 1 and assessed by myself, K. Ahmad and Dr. Leo Grosser.

## 2020-05-21 NOTE — H&P (Signed)
OB Admit History and Physical    Amber Caldwell is a 35 yr old yr old female, G4P3003, at [redacted]w[redacted]d here for SROM at 1300.  Pt denies VB, has had rare cramping, continues to have LOF, notes good FM.      ROS:  No HA/vision changes/CP/SOB/Nausea/vomiting/swelling.  Pt denies cough, fever, exposure to COVID within past 14 days.  Partner here with here also denies COVID risks.    PMH: Patient Active Problem List:     GERD (GASTROESOPHAGEAL REFLUX DISEASE)      Date Noted: 04/08/2020    VACCINATION FOR TETANUS, DIPHTHERIA AND ACELLULAR PERTUSSIS      Date Noted: 02/26/2020    VACCINATION FOR INFLUENZA      Date Noted: 02/26/2020    ABNL CERVICAL PAP      Date Noted: 12/05/2019      Comment: Repeat pap and colpo post partum     POSITIVE INTERFERON GAMMA RELEASE ASSAY      Date Noted: 11/26/2019      Comment: TB screen    CASE / CARE MGMT      Date Noted: 11/23/2019      Comment: .Fatuma Dowers, a 35 yr old female was               referred to Perinatal Case Management for [place]               Medicaid coverage  on 11/23/19. [place] was               contacted on 12/11/19 and agrees to participate               in [facility].                                                             Shanon Brow RN MSN CCM               Perinatal Case Manager                [phone]                   SUPERVISION HIGH RISK PREGNANCY, (AGE>=35), ELDERLY MULTIGRAVIDA      Date Noted: 11/22/2019      Comment: Scheduled for Induction @ 41.1 weeks of               gestation for Postdates at [facility].                Date and time of               Induction------------------------- 05/26/2020 @               0730               Requesting MD               -------------------------------------- Dr.               Willeen Cass               On call MD for               Induction---------------------------- Dr. Milinda Pointer  Completed by:  Adora Fridge RN, May 13, 2020, 7:27 PM                                    PObHx:    OB History   Gravida Para Term Preterm AB Living   4 3 3     3    SAB TAB Ectopic Molar Multiple Live Births             3      # Outcome Date GA Lbr Len/2nd Weight Sex Delivery Anes PTL Lv   4 Current            3 Term 01/16/19   8 lb 4 oz (3.742 kg) M Vag-Spont   LIV   2 Term 10/28/14   7 lb 14 oz (3.572 kg) F Vag-Spont  N LIV   1 Term 08/23/06   7 lb (3.175 kg) M Vag-Spont  N LIV       PSH:   No past surgical history on file.  Meds: No active medications on file as of 05/21/2020  All:   ALLERGIES   Allergen Reactions    Chloroquine Phosphate Itching     FHx:    Family History   Problem Relation Age of Onset    Hypertension Father     Alcohol Abuse No Family Hx     Asthma No Family Hx     Breast Cancer No Family Hx     Child with birth defects No Family Hx     Colon Cancer No Family Hx     Congenital Heart Defect No Family Hx     Cystic Fibrosis No Family Hx     Deep Vein Thrombosis No Family Hx     Diabetes No Family Hx     Down Syndrome No Family Hx     Drug Abuse No Family Hx     Fragile X Syndrome No Family Hx     Genetic Disorder No Family Hx     Heart Disease No Family Hx     Hemophilia No Family Hx     Intellectual Disability No Family Hx     Muscular Dystrophy No Family Hx     Neural Tube Defect No Family Hx     Ovarian Cancer No Family Hx     Phenylketonuria (PKU) No Family Hx     Pregnancy loss/stillbirth No Family Hx     Sickle Cell Anemia No Family Hx     Sickle Cell Trait No Family Hx     Spontaneous Abortion No Family Hx     Tay-Sachs No Family Hx     Thalassemia No Family Hx     Uterine Cancer No Family Hx      SH:     Tobacco - no    EtOH - no    Drugs -no    OBJECTIVE:  Vitals: AFVSS  Gen/Mental Status: well-developed, nad, A&O x 3  Abd: gravid, NT, ND, no masses  Extremities no edema  EFW 7.5 lb  Vertex  Vulva wnl, no lesions  Vagina gross rom - clear fluid  Cervix 3/50/-2  Pelvis feels adequate to attempt TOL  NST Cat 1  Toco  Ctx rare    LABS:  O POS   Rubella:  pos  Ab screen: neg  Syphilis: neg  HepBSAg: neg  HIV: neg  Hgb electrophoresis: normal  Varicella: pos  GC/CT neg  Urine culture neg  No results found for: WBC ONCO  Lab Results   Component Value Date    WBC 8.0 02/26/2020    HGB 11.7 02/26/2020    HCT 34.7 02/26/2020    PLT 201 02/26/2020       Last Pap Smear : 11/22/19 (PAP, (>=30 YRS), LIQUID BASED, AND HIGH RISK HPV COTEST W REFLEX TO HPV GENOTYPE)     FTS: normal nips/AFP neg  U/S:  Date:  01/03/2020, posterior placenta, no  anomalies  Glucola 107  GBS  neg      ASSESSMENT:  1. IUP at [redacted]w[redacted]d, SROM    PLAN:  1.  Admit  2.  Reviewed what to expect with patient.  Discussed that although a vaginal delivery is always our goal, there is always a possibility of cesarean delivery for maternal and fetal safety or for arrest of a labor. Consented for delivery and reviewed risks of bleeding, need for transfusion, injury to vagina, bowels, bladder, blood vessels and nerves.  Reviewed risks of uncontrolled hemorrhage needing possible procedures ie D&C or interventional radiology procedure or possible hysterectomy.  Also reviewed risks of possible fetal injury during labor and delivery.  Questions answered.  3.  Fetus  - cat 1  4.  GBS neg  5.  Augmentation/induction:  oxytocin  6.  Anesthesia:  As desired   7.  COVID screening - neg  8.  Anticipate SVD    Completed by:  Ellison Hughs MD, May 21, 2020, 7:47 PM

## 2020-05-22 ENCOUNTER — Encounter: Payer: Self-pay | Admitting: Anesthesiology

## 2020-05-22 ENCOUNTER — Encounter: Payer: Self-pay | Admitting: Obstetrics & Gynecology

## 2020-05-22 MED ORDER — BISACODYL 10 MG RE SUPP
10.0000 mg | Freq: Every day | RECTAL | Status: DC | PRN
Start: 2020-05-22 — End: 2020-05-24

## 2020-05-22 MED ORDER — PROMETHAZINE HCL 25 MG PO TABS
12.5000 mg | ORAL_TABLET | Freq: Four times a day (QID) | ORAL | Status: DC | PRN
Start: 2020-05-22 — End: 2020-05-24

## 2020-05-22 MED ORDER — MAGNESIUM HYDROXIDE 400 MG/5ML PO SUSP
30.0000 mL | Freq: Four times a day (QID) | ORAL | Status: DC | PRN
Start: 2020-05-22 — End: 2020-05-24
  Administered 2020-05-23: 19:00:00 30 mL via ORAL
  Filled 2020-05-22: qty 30

## 2020-05-22 MED ORDER — METHYLERGONOVINE MALEATE 0.2 MG/ML IJ SOLN
0.2000 mg | Freq: Four times a day (QID) | INTRAMUSCULAR | Status: DC | PRN
Start: 2020-05-22 — End: 2020-05-24

## 2020-05-22 MED ORDER — NALOXONE HCL 0.4 MG/ML IJ SOLN (WRAP)
0.2000 mg | INTRAMUSCULAR | Status: DC | PRN
Start: 2020-05-22 — End: 2020-05-24

## 2020-05-22 MED ORDER — SOD CITRATE-CITRIC ACID 500-334 MG/5ML PO SOLN
30.0000 mL | Freq: Once | ORAL | Status: DC | PRN
Start: 2020-05-22 — End: 2020-05-22

## 2020-05-22 MED ORDER — MISOPROSTOL 100 MCG PO TABS
800.0000 ug | ORAL_TABLET | Freq: Once | ORAL | Status: DC | PRN
Start: 2020-05-22 — End: 2020-05-24

## 2020-05-22 MED ORDER — NIFEDIPINE 10 MG PO CAPS
10.0000 mg | ORAL_CAPSULE | Freq: Once | ORAL | Status: DC | PRN
Start: 2020-05-22 — End: 2020-05-24

## 2020-05-22 MED ORDER — SENNOSIDES-DOCUSATE SODIUM 8.6-50 MG PO TABS
1.0000 | ORAL_TABLET | Freq: Every evening | ORAL | Status: DC | PRN
Start: 2020-05-22 — End: 2020-05-24
  Administered 2020-05-23: 05:00:00 1 via ORAL
  Filled 2020-05-22: qty 1

## 2020-05-22 MED ORDER — METHYLERGONOVINE MALEATE 0.2 MG PO TABS
0.2000 mg | ORAL_TABLET | Freq: Four times a day (QID) | ORAL | Status: DC | PRN
Start: 2020-05-22 — End: 2020-05-24

## 2020-05-22 MED ORDER — MEASLES, MUMPS & RUBELLA VAC IJ SOLR
0.5000 mL | INTRAMUSCULAR | Status: AC | PRN
Start: 2020-05-22 — End: 2020-05-23
  Administered 2020-05-23: 13:00:00 0.5 mL via SUBCUTANEOUS
  Filled 2020-05-22: qty 0.5

## 2020-05-22 MED ORDER — FENTANYL-BUPIVACAINE-NACL 0.2-0.125-0.9 MG/100ML-% EP SOLN
EPIDURAL | Status: DC
Start: 2020-05-22 — End: 2020-05-22
  Administered 2020-05-22: 12:00:00 100 mL via EPIDURAL
  Administered 2020-05-22: 02:00:00 10 mL/h via EPIDURAL
  Filled 2020-05-22 (×2): qty 100

## 2020-05-22 MED ORDER — LANOLIN EX OINT
TOPICAL_OINTMENT | CUTANEOUS | Status: DC | PRN
Start: 2020-05-22 — End: 2020-05-24
  Filled 2020-05-22: qty 7

## 2020-05-22 MED ORDER — EPHEDRINE SULFATE 50 MG/ML IJ/IV SOLN (WRAP)
10.0000 mg | Freq: Once | Status: DC | PRN
Start: 2020-05-22 — End: 2020-05-22

## 2020-05-22 MED ORDER — BENZOCAINE 20% +/- MENTHOL 0.5% EX AERO (WRAP)
1.0000 | INHALATION_SPRAY | CUTANEOUS | Status: DC | PRN
Start: 2020-05-22 — End: 2020-05-24
  Administered 2020-05-22: 23:00:00 1 via TOPICAL
  Filled 2020-05-22: qty 56

## 2020-05-22 MED ORDER — TRANEXAMIC ACID-NACL 1000-0.7 MG/100ML-% IV SOLN
1000.0000 mg | Freq: Once | INTRAVENOUS | Status: AC
Start: 2020-05-22 — End: 2020-05-22
  Administered 2020-05-22: 18:00:00 1000 mg via INTRAVENOUS
  Filled 2020-05-22: qty 100

## 2020-05-22 MED ORDER — AMMONIA AROMATIC IN INHA
1.0000 | Freq: Once | RESPIRATORY_TRACT | Status: DC | PRN
Start: 2020-05-22 — End: 2020-05-24

## 2020-05-22 MED ORDER — SODIUM CHLORIDE 0.9 % IV SOLN
INTRAVENOUS | Status: DC | PRN
Start: 2020-05-22 — End: 2020-05-22
  Administered 2020-05-22: 16:00:00 10 mL via EPIDURAL

## 2020-05-22 MED ORDER — OXYTOCIN-SODIUM CHLORIDE 30-0.9 UT/500ML-% IV SOLN
7.5000 [IU]/h | INTRAVENOUS | Status: DC
Start: 2020-05-22 — End: 2020-05-24

## 2020-05-22 MED ORDER — ACETAMINOPHEN 650 MG RE SUPP
650.0000 mg | RECTAL | Status: DC | PRN
Start: 2020-05-22 — End: 2020-05-24

## 2020-05-22 MED ORDER — HYDROCORTISONE 1 % EX OINT
TOPICAL_OINTMENT | Freq: Three times a day (TID) | CUTANEOUS | Status: DC | PRN
Start: 2020-05-22 — End: 2020-05-24

## 2020-05-22 MED ORDER — LACTATED RINGERS IV BOLUS
1000.0000 mL | Freq: Once | INTRAVENOUS | Status: AC
Start: 2020-05-22 — End: 2020-05-22
  Administered 2020-05-22: 02:00:00 1000 mL via INTRAVENOUS

## 2020-05-22 MED ORDER — ONDANSETRON HCL 4 MG/2ML IJ SOLN
4.0000 mg | Freq: Three times a day (TID) | INTRAMUSCULAR | Status: DC | PRN
Start: 2020-05-22 — End: 2020-05-24

## 2020-05-22 MED ORDER — TETANUS-DIPHTH-ACELL PERTUSSIS 5-2.5-18.5 LF-MCG/0.5 IM SUSP
0.5000 mL | INTRAMUSCULAR | Status: DC | PRN
Start: 2020-05-22 — End: 2020-05-24

## 2020-05-22 MED ORDER — METHYLERGONOVINE MALEATE 0.2 MG/ML IJ SOLN
0.2000 mg | INTRAMUSCULAR | Status: DC | PRN
Start: 2020-05-22 — End: 2020-05-24

## 2020-05-22 MED ORDER — METOCLOPRAMIDE HCL 5 MG/ML IJ SOLN
10.0000 mg | Freq: Once | INTRAMUSCULAR | Status: DC | PRN
Start: 2020-05-22 — End: 2020-05-22

## 2020-05-22 MED ORDER — WITCH HAZEL EX PADS (WRAP)
1.0000 | MEDICATED_PAD | CUTANEOUS | Status: DC | PRN
Start: 2020-05-22 — End: 2020-05-24
  Administered 2020-05-22: 23:00:00 1 via TOPICAL
  Filled 2020-05-22: qty 40

## 2020-05-22 MED ORDER — IBUPROFEN 600 MG PO TABS
600.0000 mg | ORAL_TABLET | Freq: Four times a day (QID) | ORAL | Status: DC
Start: 2020-05-23 — End: 2020-05-24
  Administered 2020-05-23 – 2020-05-24 (×6): 600 mg via ORAL
  Filled 2020-05-22 (×6): qty 1

## 2020-05-22 MED ORDER — LIDOCAINE-EPINEPHRINE 1.5 %-1:200000 IJ SOLN
INTRAMUSCULAR | Status: DC | PRN
Start: 2020-05-22 — End: 2020-05-22
  Administered 2020-05-22: 3 mL via EPIDURAL
  Administered 2020-05-22: 2 mL via EPIDURAL

## 2020-05-22 MED ORDER — OXYCODONE HCL 5 MG PO TABS
5.0000 mg | ORAL_TABLET | ORAL | Status: DC | PRN
Start: 2020-05-22 — End: 2020-05-24

## 2020-05-22 MED ORDER — SODIUM CHLORIDE 0.9 % IV SOLN
6.2500 mg | Freq: Four times a day (QID) | INTRAVENOUS | Status: DC | PRN
Start: 2020-05-22 — End: 2020-05-24

## 2020-05-22 MED ORDER — PRENATAL PLUS IRON 29-1 MG PO TABS
1.0000 | ORAL_TABLET | Freq: Every day | ORAL | Status: DC
Start: 2020-05-22 — End: 2020-05-24
  Administered 2020-05-23 – 2020-05-24 (×2): 1 via ORAL
  Filled 2020-05-22 (×2): qty 1

## 2020-05-22 MED ORDER — NIFEDIPINE 10 MG PO CAPS
20.0000 mg | ORAL_CAPSULE | Freq: Once | ORAL | Status: DC | PRN
Start: 2020-05-22 — End: 2020-05-24

## 2020-05-22 MED ORDER — OXYCODONE HCL 5 MG PO TABS
5.0000 mg | ORAL_TABLET | Freq: Once | ORAL | Status: DC | PRN
Start: 2020-05-22 — End: 2020-05-24

## 2020-05-22 MED ORDER — IBUPROFEN 600 MG PO TABS
600.0000 mg | ORAL_TABLET | Freq: Once | ORAL | Status: AC | PRN
Start: 2020-05-22 — End: 2020-05-22
  Administered 2020-05-22: 19:00:00 600 mg via ORAL
  Filled 2020-05-22: qty 1

## 2020-05-22 MED ORDER — ONDANSETRON 4 MG PO TBDP
4.0000 mg | ORAL_TABLET | Freq: Three times a day (TID) | ORAL | Status: DC | PRN
Start: 2020-05-22 — End: 2020-05-24

## 2020-05-22 MED ORDER — FAMOTIDINE 10 MG/ML IV SOLN (WRAP)
20.0000 mg | Freq: Once | INTRAVENOUS | Status: DC | PRN
Start: 2020-05-22 — End: 2020-05-22

## 2020-05-22 MED ORDER — ACETAMINOPHEN 325 MG PO TABS
650.0000 mg | ORAL_TABLET | ORAL | Status: DC | PRN
Start: 2020-05-22 — End: 2020-05-24

## 2020-05-22 MED ORDER — PROMETHAZINE HCL 25 MG RE SUPP
12.5000 mg | Freq: Four times a day (QID) | RECTAL | Status: DC | PRN
Start: 2020-05-22 — End: 2020-05-24

## 2020-05-22 MED ORDER — NALOXONE HCL 0.4 MG/ML IJ SOLN (WRAP)
0.1000 mg | INTRAMUSCULAR | Status: DC | PRN
Start: 2020-05-22 — End: 2020-05-22

## 2020-05-22 NOTE — Progress Notes (Signed)
10:00AM  No c/o  Pelvic- 5/75/-2  NST- cat 1  toco- Uc every 4-6 min  Increase pit    12:30PM  No c/o  Pelvic- 6/80/-2  NST- cat 1  toco- Uc every 3-5 min  Continue pit    4:00PM  No c/o  Pelvic- 9/100/-1  NST- cat 1  toco- Uc every 3-5 min  continue pit

## 2020-05-22 NOTE — Anesthesia Preprocedure Evaluation (Signed)
Anesthesia Evaluation    AIRWAY    Mallampati: II    TM distance: >3 FB  Neck ROM: full  Mouth Opening:full   CARDIOVASCULAR    cardiovascular exam normal       DENTAL    no notable dental hx     PULMONARY    pulmonary exam normal     OTHER FINDINGS                Lab Results   Component Value Date    WBC 8.21 05/21/2020    HGB 12.3 05/21/2020    HCT 36.9 05/21/2020    MCV 83.5 05/21/2020    PLT 202 05/21/2020         Relevant Problems   No relevant active problems               Anesthesia Plan    ASA 2     epidural                                 informed consent obtained    Plan discussed with attending.      pertinent labs reviewed             Signed by: Patton Salles, MD 05/22/20 2:22 AM

## 2020-05-22 NOTE — Progress Notes (Signed)
FHR minimal variability w/o accelerations. Pt given 8 oz of cranberry juice and LR bolus to encourage reactivity.

## 2020-05-22 NOTE — Plan of Care (Signed)
Problem: Vaginal/Cesarean Delivery  Goal: Postpartum management of pain/discomfort  Outcome: Progressing  Flowsheets (Taken 05/22/2020 2227)  Postpartum management of pain/discomfort:   Assess pain using a consistent, developmental/age appropriate pain scale   Assess pain level before and following intervention   Include patient/patient care companion in decisions related to pain management  Goal: Breasts are soft with nipple integrity intact  Outcome: Progressing  Flowsheets (Taken 05/22/2020 2227)  Breasts are soft with nipple integrity intact:   Perform breast/nipple assessment   Assess and manage engorgement   Breastfeed and/or pump breasts at least 8-12 times within 24 hours   Ensure proper positioning and latch  Goal: Gastrointestinal/Urinary management  Outcome: Progressing  Flowsheets (Taken 05/22/2020 2227)  Gastrointestinal/Urinary management:   Use CAUTI prevention techniques   Maintain urine output of 30/mL per hour or greater   Adequate bladder emptying per phase of care  Goal: Uterine management  Outcome: Progressing  Flowsheets (Taken 05/22/2020 2227)  Uterine management:   Assess fundus and notify LIP if not firm, midline, or at or below the umbilicus, or if abdomen is abnormally distended   Assess for hemorrhage risk using appropriate screening tool  Goal: Perineum will be clean, dry, and intact and without discharge or hematoma  Outcome: Progressing  Flowsheets (Taken 05/22/2020 2227)  Perineum will be clean, dry, and intact and without discharge or hematoma:   Place cold pack on perineum   Provide pericare   Assess for hemorrhoids and provide interventions  Goal: VTE Prevention  Outcome: Progressing  Flowsheets (Taken 05/22/2020 2227)  VTE prevention:   Patient ambulating independently   Assess skin color, turgor, peripheral pulses, perfusion, and presence of edema, e.g. capillary refill  Goal: Evidence of positive mother-baby interactions  Outcome: Progressing  Flowsheets (Taken 05/22/2020 2227)  Evidence of  positive mother-baby interactions:   Include patient/patient care companion in decisions related to care   Initiate skin to skin   Initiate safety and falls prevention interventions   Assess emotional status and coping mechanisms   Encourage rooming in and infant feeding on demand   Ensure parent/caregiver provides infant care

## 2020-05-22 NOTE — Anesthesia Procedure Notes (Signed)
Epidural    Patient location during procedure: L&D  Reason for block: Labor or C-section  Block at Surgeon's request: Yes    Start time: 05/22/2020 1:56 AM  End time: 05/22/2020 2:07 AM    Staffing  Performed: anesthesiologist     Pre-procedure Checklist   Completed: patient identified, site marked, surgical consent, pre-op evaluation, timeout performed, risks and benefits discussed, monitors and equipment checked, anesthesia consent given and correct site      Epidural  Patient monitoring: Pulse oximetry and NIBP    Premedication: No  Patient position: sitting    Skin Local: bupivicaine 0.25%    Attempts  Number of attempts: 2    Attempt 1  Interspace: L3-4  Approach: midline                Successful attempt  Interspace: L3-4  Approach: midline    Needle type: Touhy needle   Needle gauge: 17  Injection technique: LOR saline  Epidural Space ID: 4.5 cm  CSF Return: No   Blood Return: No  Paresthesia Pain: No    Needle Placement  Needle type: Touhy needle   Needle gauge: 17  Injection technique: LOR saline  CSF Return: No  Blood Return: No          Paresthesia Pain: No    Catheter Placement   Catheter type: side hole  Catheter size: 19 G  Catheter at skin depth: 10 cm  CSF Return: No  Blood Return: No  Test Dose:1.5 % lidocaine  3 cc  Incremental injection: yes  Injection made incrementally with aspirations every 1 mL.    No Catheter IV/SA Signs or Symptoms    Assessment   Sensory level: Bilateral and T10  Patient tolerated procedure well: Yes  Block Outcome: no complications, successful block and pain improved

## 2020-05-22 NOTE — Progress Notes (Signed)
Mom and baby transferred from L&D to [facility] room 347.  ID bands on baby's ankles, match mom's.  Verified with L&D RN.  Correct pediatrician verified with mom.     Mother breastfed previous children for 1 year. Mother has not requested assistance with breastfeeding at this time. Observed mother placing infant to breast in cradle position and infant began to actively suck on the nipple.

## 2020-05-22 NOTE — Plan of Care (Signed)
Problem: Safety  Goal: Patient will be free from injury during hospitalization  Outcome: Progressing  Goal: Patient will be free from infection during hospitalization  Outcome: Progressing     Problem: Pain  Goal: Pain at adequate level as identified by patient  Outcome: Progressing     Problem: Side Effects from Pain Analgesia  Goal: Patient will experience minimal side effects of analgesic therapy  Outcome: Progressing     Problem: Discharge Barriers  Goal: Patient will be discharged home or other facility with appropriate resources  Outcome: Progressing     Problem: Psychosocial and Spiritual Needs  Goal: Demonstrates ability to cope with hospitalization/illness  Outcome: Progressing     Problem: Compromised Tissue integrity  Goal: Damaged tissue is healing and protected  Outcome: Progressing  Goal: Nutritional status is improving  Outcome: Progressing     Problem: Moderate/High Fall Risk Score >5  Goal: Patient will remain free of falls  Outcome: Progressing

## 2020-05-22 NOTE — Progress Notes (Signed)
Bedside report given to Joneen Boers, RN. Patient care relinquished at this time.

## 2020-05-22 NOTE — Progress Notes (Signed)
MOM     34 y.o.  [id number]  [redacted]w[redacted]d weeks  Allergies   Allergen Reactions    Chloroquine Itching     Her current pregnancy is significant for   Patient Active Problem List   Diagnosis    Normal labor         History reviewed. No pertinent past medical history.    History reviewed. No pertinent surgical history.    Pertinent prenatal Labs -   Lab Results   Component Value Date    ABORH O POS 05/21/2020    HEPBSAG Negative 11/22/2019    GBS Negative 04/29/2020    RUBELLAABIGG Non-Immune 11/22/2019    RPR Nonreactive 11/22/2019       Interpreter Waiver Signed: N/A    Delivering Clinician:     Dr. Nedra Hai  Delivery Type:     Vaginal, Spontaneous   Delivery Date and Time: 05/22/2020  4:47 PM     Delivery Complications: Prolonged rupture of membranes    Rupture Date - 05/22/2019   Rupture time - 3:00 PM   Fluid color - Clear     Laceration (Yes/None):        Laceration Type:     1st   Episiotomy:     None      Anesthesia: Epidural      ADDITIONAL COMMENTS:    Prenatal Scanned into Epic: Yes        Ibuprofen given prior to transfer to [facility] (for vaginal deliveries only)- Yes @ 1852  Other Medications given in L&D (list ERAC medications if applicable)- Pitocin x2, TXA      Recent Output- Straight cath @1730  for     Fundus- Firm at U  Lochia- Rubra, small      EBL: 250  Pain Score: 2    Prolonged 2nd stage of labor  No       Precipitous labor < 3 hours  No       Prolong Oxytocin use  Yes       Patient up with [facility] -  N/A            BABY    [phone]     Baby Safety Contract Signed Yes       Vaginal, Spontaneous  of live female  infant 7 lb 15.3 oz (3610 g)      1 Minute Apgar:     8      5 Minute Apgar:     9     Nuchal Cord:  0    Fenton Growth Scale Percentile: 57.14%   >90% or <10% No  ; Hypoglycemia Protocol Initiated: No        Coombs Results: negative; Lab Notified of Cord Bili needing to be drawn Not Applicable    Other Protocols on Newborn: Sepsis    ADDITIONAL COMMENTS:  Feeding: Breast feeding and  bottle  Skin-to-skin initiated Yes               Labs due- 8 hrs and 24 hrs  Cord blood- Lab/Blood Bank   Eyes & Thighs: done    Void - No   Stool - 0     Pediatrician Notification:  If not house pediatrician, was private Pediatrician Notified: No, peds notification form filled out Yes    At home Pediatrician: [facility] Pediatrician: [place]     ID bands on baby's ankles match mom's. Bedside report given to [facility] RN using ISHAPED report. Pt care relinquished.  Hetty Blend, RN  05/22/2020 6:27 PM

## 2020-05-22 NOTE — Progress Notes (Signed)
Patient transferred to room 347  Baby bands checked against patient and FOB.  Bedside report given to [place], [place].  Patient care relinquished at this time.

## 2020-05-22 NOTE — Progress Notes (Signed)
Contractions spaced S/P epidural (@0200 ) - Pitocin now being increased.  Will reexamine after adequate contraction pattern restored > 1 hour.    FHT's category 1    Druscilla Brownie, MD

## 2020-05-22 NOTE — Progress Notes (Signed)
Report received from Brittany RN.  Pt care assumed at this time.

## 2020-05-22 NOTE — Progress Notes (Addendum)
Patient report received from [place], [place].  Patient care assumed at this time.

## 2020-05-22 NOTE — Progress Notes (Signed)
Dr. Nedra Hai and RN at bedside for pushing and continuous fetal monitoring.

## 2020-05-22 NOTE — Progress Notes (Addendum)
LABOR&DELIVERY NOTE  [Chart reviewed and verbal sign-out with Dr. Leo Grosser    Pt reports contractions are slightly stronger.    PE:  BP 108/61    Pulse 80    Temp 98.2 F (36.8 C) (Axillary)    Resp 16    Ht 1.676 m (5\' 6" )    Wt 86.2 kg (190 lb)    BMI 30.67 kg/m    ABD: contractions q4-25minutes, FHT: category 1 / reactive - Pitocin @ 60mIU/Min  PELVIC: Cx - 3-4cm / 70% / -3 station / fluid clear    A/P Continue present management      Druscilla Brownie, MD

## 2020-05-23 MED ORDER — CALCIUM CARBONATE ANTACID 500 MG PO CHEW
500.0000 mg | CHEWABLE_TABLET | Freq: Four times a day (QID) | ORAL | Status: DC | PRN
Start: 2020-05-23 — End: 2020-05-24
  Administered 2020-05-23: 05:00:00 500 mg via ORAL
  Filled 2020-05-23: qty 1

## 2020-05-23 MED ORDER — IBUPROFEN 600 MG PO TABS
600.0000 mg | ORAL_TABLET | Freq: Four times a day (QID) | ORAL | 0 refills | Status: AC
Start: 2020-05-23 — End: ?

## 2020-05-23 NOTE — Anesthesia Postprocedure Evaluation (Signed)
Anesthesia Post Evaluation    Patient: Amber Caldwell    * No procedures listed *    Anesthesia type: epidural    Last Vitals:   Vitals Value Taken Time   BP 128/83 05/23/20 0738   Temp 36.5 C (97.7 F) 05/23/20 0738   Pulse 72 05/23/20 0738   Resp 16 05/23/20 0738   SpO2 100 % 05/23/20 0738                 Anesthesia Post Evaluation:     Patient Evaluated: bedside  Patient Participation: complete - patient participated  Level of Consciousness: awake and alert  Pain Score: 0  Pain Management: adequate    Airway Patency: patent    Anesthetic complications: No      PONV Status: none    Cardiovascular status: acceptable  Respiratory status: acceptable  Hydration status: acceptable  Comments: Taking PO. No HA. No N/V. No apparent anesthetic complications.                    Signed by: Darnelle Catalan, MD, 05/23/2020 8:07 AM

## 2020-05-23 NOTE — Discharge Instr - AVS First Page (Addendum)
Reason for your Hospital Admission:  Delivery of your baby      Instructions for after your discharge:  POSTPARTUM VAGINAL DELIVERY INSTRUCTIONS  Please use Ibuprofen 600 mg every 6 hours or 800 mg every 8 hours for pain as needed. You can use Ibuprofen that you have at home but you will need either 3 or 4 pills. Be sure and take it with food.   Activity during the first two weeks:  Gradually increase your daily activity so that in about two weeks you are leading a fairly normal life. We recommend daily rest periods during this time. You may shower, bathe or wash your hair at anytime after the birth of your baby.   Bowel Movements:  After a bowel movement, wipe from front to back. If you have hemorrhoids they usually improve in the first days or weeks following delivery. Sitz baths will occasionally give considerable relief. You should relieve constipation by ample intake of fluids each day or, if necessary, take a laxative like Milk of Magnesia. A stool softener, such as psyllium powder (Metamucil) or Colace, may also be used.  Care of the Perineum:  Episiotomy/ Tear Care:  Your perineal area may cause you discomfort depending on the type of delivery you had. If it is uncomfortable after you return home, the following should be done:  Sit in a tub containing several inches of plain warm water for 15 minutes two to three times a day (Sitz baths)  Apply Tucks pads afterward (place in freezer and they may be more soothing)  Continue to use your peri-bottle after each void while having vaginal flow  Keep your stools soft as discussed above.  Lochia:  The original bloody vaginal discharge called "lochia" will gradually decrease and change to pink, then brown and finally to yellow. You may bleed for as long as six weeks.   Breasts:  During pregnancy, your breasts prepare for lactation (milk production) and after birth hormonal changes and infant sucking trigger a surge in milk supply. Nursing on demand every one to three  hours for 15 to 20 minutes on each breast should empty the breasts and provide proper nutrition and fluids for your infant. Sore nipples and engorgement are common in the early stages of breastfeeding and the most frequent causes for new mothers deciding to stop breastfeeding. Proper positioning and latching with frequent nursing can help alleviate these temporary and uncomfortable symptoms. Call the lactation consultants ([phone]) for advice if you are having problems. During growth spurts your infant may nurse more frequently for one to two days for the milk supply to catch up with his/her needs. It is recommended that infants be exclusively breastfed for the first six months to provide optimal nutrition and protection against infections. Other benefits from breastfeeding include decreasing the risk of subsequent pregnancy and assisting with weight loss. When you are breastfeeding it is important to continue eating a well balanced diet. You actually need more calories while breastfeeding than you did during your pregnancy! It is a good idea to continue your prenatal vitamins while breastfeeding as well as a lot of fluids (64 ounces a day).  Non-Breastfeeding Mothers:  Should expect a period of engorgement treatable with form-fitting bras, ice treatments and avoiding stimulation to the breast (pumping or hot showers). Tylenol or Ibuprofen may be taken every three to four hours for relief of symptoms. Cabbage leaves placed on your breast may also relieve your symptoms. Symptoms should resolve within 24 to 48 hours although leaking   of milk may continue for days or weeks.  Exercise:  You may begin two to three weeks after the baby is born. Start gradually and be consistent. Light walking for 20 to 30 minutes is a good activity. It is normal to have some swelling of your feet and legs. If moderate to extreme, elevate your legs higher than your heart several times a day. You may begin Kegel exercises two to three  weeks after delivery. Kegel exercises help increase the strength of your pelvic muscles. If you desire, you may begin swimming two to three weeks after delivery.  Postpartum Blues:  It is common to have postpartum blues. This is a normal response to many of the hormonal changes, stress and lack of sleep that go with raising a newborn and physically recovering from the birth. Activities that can be helpful include:  Getting more sleep  Try to find time for your own needs, including recreation and social activities with friends, family and your partner. A short period away from infants who are requiring your constant care and attention can be very helpful.   "After Pains":  The "after pains" may be bothersome for first time mothers and more intense with subsequent babies, especially during breastfeeding. Iibuprofen every three to four hours will alleviate the discomfort. Expect cramping to continue for the first several days after birth.  The First Menstrual Period:  Will often occur within the first two months, however, you may not have periods while nursing. The first period may be unusually heavy or prolonged. If you are concerned about this, please call the office.  Sexual Intercourse:  We recommend waiting at least 6 weeks before resuming sexual activity.Due to hormonal changes and the birth process, you may experience vaginal dryness and painful intercourse. This can be relieved with the use of a vaginal lubricant, such as Astroglide. Do not depend on breastfeeding to adequately prevent pregnancy.  Postpartum Depression:  Postpartum depression is not uncommon and you should call your physician if you find yourself in a downhill spiral marked by:  Prolonged crying spells.  Thoughts of harming yourself, the baby or others.  Severe anxiety.  Inability to function or care for your newborn or yourself.  Depressive symptoms lasting longer than two weeks.  Please Call Us If You Have Any Of The Following:  Fever with or  without chills (temperature of 100.4? or higher).  Any difficulty with urination (burning, frequency).  Vaginal bleeding that is excessive (soaking more than one pad per hour).  Sudden extreme weakness or loss of consciousness.  Swelling, redness or tenderness in one area of the breast or leg.  Anything that troubles you.

## 2020-05-23 NOTE — Progress Notes (Signed)
Postpartum Note    Subjective:  Delivery Type: Vaginal   No complaints.  Minimal Bleeding.  Breastfeeding well.    Objective:  Vital Signs:  Temp:  [97.7 F (36.5 C)-98.3 F (36.8 C)] 97.7 F (36.5 C)  Heart Rate:  [67-92] 72  Resp Rate:  [16-18] 16  BP: (98-148)/(55-83) 128/83   General: pleasant female; AO x 3  Breast: soft, non tender   Fundus: firm, non tender  Lochia: mod, rubra.  Extremities: no edema; negative Homan's sign    Recent Labs:  Recent Labs   Lab 05/21/20  2033   WBC 8.21   Hgb 12.3   Hematocrit 36.9   Platelets 202       Assessment/Plan:  Stable - Postpartum Day 1  Routine care, discharge  tomorrow, F/U in 6 weeks.  Prescriptions given: PNV, Motrin.

## 2020-05-23 NOTE — Plan of Care (Signed)
Problem: Safety  Goal: Patient will be free from injury during hospitalization  Outcome: Progressing  Goal: Patient will be free from infection during hospitalization  Outcome: Progressing     Problem: Pain  Goal: Pain at adequate level as identified by patient  Outcome: Progressing     Problem: Side Effects from Pain Analgesia  Goal: Patient will experience minimal side effects of analgesic therapy  Outcome: Progressing     Problem: Discharge Barriers  Goal: Patient will be discharged home or other facility with appropriate resources  Outcome: Progressing     Problem: Psychosocial and Spiritual Needs  Goal: Demonstrates ability to cope with hospitalization/illness  Outcome: Progressing     Problem: Compromised Tissue integrity  Goal: Damaged tissue is healing and protected  Outcome: Progressing  Goal: Nutritional status is improving  Outcome: Progressing     Problem: Moderate/High Fall Risk Score >5  Goal: Patient will remain free of falls  Outcome: Progressing     Problem: Vaginal/Cesarean Delivery  Goal: Postpartum management of pain/discomfort  Outcome: Progressing  Flowsheets (Taken 05/23/2020 1104)  Postpartum management of pain/discomfort:   Assess pain using a consistent, developmental/age appropriate pain scale   Assess pain level before and following intervention   Monitor for post anesthesia issues related to pain management   Include patient/patient care companion in decisions related to pain management  Goal: Breasts are soft with nipple integrity intact  Outcome: Progressing  Flowsheets (Taken 05/23/2020 1104)  Breasts are soft with nipple integrity intact:   Perform breast/nipple assessment   Assess and manage engorgement   Breastfeed and/or pump breasts at least 8-12 times within 24 hours   Ensure proper positioning and latch  Goal: Gastrointestinal/Urinary management  Outcome: Progressing  Flowsheets (Taken 05/23/2020 1104)  Gastrointestinal/Urinary management: Adequate bladder emptying per phase of  care  Goal: Uterine management  Outcome: Progressing  Flowsheets (Taken 05/23/2020 1104)  Uterine management:   Assess fundus and notify LIP if not firm, midline, or at or below the umbilicus, or if abdomen is abnormally distended   Assess for hemorrhage risk using appropriate screening tool  Goal: Perineum will be clean, dry, and intact and without discharge or hematoma  Outcome: Progressing  Flowsheets (Taken 05/23/2020 1104)  Perineum will be clean, dry, and intact and without discharge or hematoma:   Provide pericare   Place cold pack on perineum   Assess for hemorrhoids and provide interventions  Goal: VTE Prevention  Outcome: Progressing  Flowsheets (Taken 05/23/2020 1104)  VTE prevention: Patient ambulating independently  Goal: Evidence of positive mother-baby interactions  Outcome: Progressing  Flowsheets (Taken 05/23/2020 1104)  Evidence of positive mother-baby interactions:   Include patient/patient care companion in decisions related to care   Initiate skin to skin   Initiate safety and falls prevention interventions   Encourage rooming in and infant feeding on demand   Assess emotional status and coping mechanisms   Ensure parent/caregiver provides infant care   Assess parent/caregiver engagement and awareness of infant cues/behavior

## 2020-05-24 NOTE — Discharge Summary (Signed)
[facility]  Obstetrical Discharge Form      Admission Diagnosis: Intrauterine Pregnancy at [redacted]w[redacted]d    Antepartum complications: none    Date of Delivery: 05/22/2020    Time of Delivery: 4:47 PM      Delivered By: Dutch Quint     Delivery Type: Vaginal, Spontaneous   Tubal Ligation: n/a    Anesthesia:     Baby: Liveborn Female ; Birth weight: 7 lb 15.3 oz (3610 g) ; Apgar 1 minute: 8  Apgar 5 minute: 9 ;                                                       Feeding Type: Breast milk     Delivery complications: none    Episiotomy: None   Laceration Type:  1st     Prenatal Labs:   Lab Results   Component Value Date    ABORH O POS 05/21/2020    RUBELLAABIGG Non-Immune 11/22/2019    HEPBSAG Negative 11/22/2019    GBS Negative 04/29/2020       Hospital Course : Amber Caldwell is a 35 y.o. with an IUP at [redacted]w[redacted]d. She was admitted to the hospital for PROM. Her antepartum course was uncomplicated.  Patient delivered via spontaneous vaginal delivery. Her postpartum course was uncomplicated. She was discharged home in a stable good condition.    Discharge Date: 05/24/20     Plan:     [place] Instructions:   The patient has been given a discharge instruction sheet which includes precautions for activity level, dietary precautions, expectations, potential complications that require contact with the office and discharge prescriptions. These instructions were reviewed with the patient. All questions were answered.    Follow-up appointment in 6 weeks if vaginal delivery and 2 weeks if c/section.

## 2020-05-24 NOTE — Plan of Care (Signed)
Problem: Vaginal/Cesarean Delivery  Goal: Postpartum management of pain/discomfort  Outcome: Progressing  Flowsheets (Taken 05/24/2020 0053)  Postpartum management of pain/discomfort:   Assess pain using a consistent, developmental/age appropriate pain scale   Include patient/patient care companion in decisions related to pain management   Assess pain level before and following intervention  Goal: Breasts are soft with nipple integrity intact  Outcome: Progressing  Flowsheets (Taken 05/24/2020 0053)  Breasts are soft with nipple integrity intact:   Assess and manage engorgement   Breastfeed and/or pump breasts at least 8-12 times within 24 hours   Perform breast/nipple assessment   Ensure proper positioning and latch  Goal: Gastrointestinal/Urinary management  Outcome: Progressing  Flowsheets (Taken 05/24/2020 0053)  Gastrointestinal/Urinary management: Adequate bladder emptying per phase of care  Goal: Uterine management  Outcome: Progressing  Flowsheets (Taken 05/24/2020 0053)  Uterine management:   Assess fundus and notify LIP if not firm, midline, or at or below the umbilicus, or if abdomen is abnormally distended   Assess for hemorrhage risk using appropriate screening tool  Goal: Perineum will be clean, dry, and intact and without discharge or hematoma  Outcome: Progressing  Flowsheets (Taken 05/24/2020 0053)  Perineum will be clean, dry, and intact and without discharge or hematoma:   Place cold pack on perineum   Provide pericare   Assess for hemorrhoids and provide interventions  Goal: VTE Prevention  Outcome: Progressing  Flowsheets (Taken 05/24/2020 0053)  VTE prevention:   Patient ambulating independently   Assess skin color, turgor, peripheral pulses, perfusion, and presence of edema, e.g. capillary refill  Goal: Evidence of positive mother-baby interactions  Outcome: Progressing  Flowsheets (Taken 05/24/2020 0053)  Evidence of positive mother-baby interactions:   Include patient/patient care companion in decisions  related to care   Initiate skin to skin   Initiate safety and falls prevention interventions   Assess emotional status and coping mechanisms   Encourage rooming in and infant feeding on demand   Ensure parent/caregiver provides infant care

## 2020-05-24 NOTE — Plan of Care (Signed)
Problem: Vaginal/Cesarean Delivery  Goal: Postpartum management of pain/discomfort  Outcome: Progressing  Flowsheets (Taken 05/24/2020 0733)  Postpartum management of pain/discomfort:   Assess pain using a consistent, developmental/age appropriate pain scale   Assess pain level before and following intervention   Include patient/patient care companion in decisions related to pain management  Goal: Breasts are soft with nipple integrity intact  Outcome: Progressing  Flowsheets (Taken 05/24/2020 0733)  Breasts are soft with nipple integrity intact:   Perform breast/nipple assessment   Assess and manage engorgement   Breastfeed and/or pump breasts at least 8-12 times within 24 hours   Ensure proper positioning and latch  Goal: Gastrointestinal/Urinary management  Outcome: Progressing  Flowsheets (Taken 05/24/2020 0733)  Gastrointestinal/Urinary management:   Monitor intake and output per orders   Adequate bladder emptying per phase of care  Goal: Uterine management  Outcome: Progressing  Flowsheets (Taken 05/24/2020 0733)  Uterine management:   Assess fundus and notify LIP if not firm, midline, or at or below the umbilicus, or if abdomen is abnormally distended   Assess for hemorrhage risk using appropriate screening tool  Goal: Perineum will be clean, dry, and intact and without discharge or hematoma  Outcome: Progressing  Flowsheets (Taken 05/24/2020 0733)  Perineum will be clean, dry, and intact and without discharge or hematoma:   Monitor wound/incision for signs of infections   Assess for hemorrhoids and provide interventions   Provide pericare  Goal: VTE Prevention  Outcome: Progressing  Flowsheets (Taken 05/24/2020 0733)  VTE prevention:   Patient ambulating independently   Assess skin color, turgor, peripheral pulses, perfusion, and presence of edema, e.g. capillary refill  Goal: Evidence of positive mother-baby interactions  Outcome: Progressing  Flowsheets (Taken 05/24/2020 0733)  Evidence of positive mother-baby  interactions:   Initiate skin to skin   Include patient/patient care companion in decisions related to care   Initiate safety and falls prevention interventions   Assess emotional status and coping mechanisms   Encourage rooming in and infant feeding on demand   Ensure parent/caregiver provides infant care   Assess parent/caregiver engagement and awareness of infant cues/behavior

## 2020-05-24 NOTE — Progress Notes (Signed)
Discharge order entered.  Vaccine screening complete and vaccines given.    [place] instructions given.  Questions answered.  Mom Garner'd

## 2020-05-24 NOTE — Lactation Note (Signed)
[name]  08-29-1985  34 y.o.     Delivery Date/Time: 05/22/2020  4:47 PM        Delivery Type:  Vaginal, Spontaneous             Laceration Type:     1st   Episiotomy:     None      GP: [id number] EBL: : 250 , 0    OB:    Dutch Quint, MD  Allergies   Allergen Reactions    Chloroquine Itching       PRENATAL    History reviewed. No pertinent past medical history.  History reviewed. No pertinent surgical history.     Baby Gender: Female   Gestation:  [redacted]w[redacted]d  APGAR: 8 and 9   Birth Weight: 7 lb 15.3 oz (3610 g)   Feeding Type:  Breast milk     LC visit. Mom is 72 yo, G4P4, PPD#2. She has been BF exclusively. She nursed her other kids for 1 year per Epic. RN reported that mom is asking about pumping.    Mom was getting ready for discharge. She said that the baby has been nursing and she has no questions about BF. The FOB at the side and was asking about pumping. Discussed that the insurance company provides free pump to all moms. Informed that when they see the Pediatrician [facility]) they will see the LC at the [facility] office too. They will give a free pump during the visit.  Educated that the baby is the best pump and that pumping in the first week will yield very small amount of milk. Reassured that the baby gets more volume when nursing compared to the volume she gets during pumping. Mom verbalized understanding.    Encouraged to continue offering her breasts 8-12/day, utilize different nursing positions, BF for at least 15 minutes on each side per session and keep track with wet and dirty diapers. Recommended to reach out to the [facility] at the [facility] office for more support with BF. Mom verbalized understanding.
